# Patient Record
Sex: Female | Born: 1990 | Race: Black or African American | Hispanic: No | Marital: Single | Smoking: Never smoker
Health system: Southern US, Community
[De-identification: ages and names within clinical notes are randomized; demographics above are authoritative.]

## PROBLEM LIST (undated history)

## (undated) DIAGNOSIS — E0590 Thyrotoxicosis, unspecified without thyrotoxic crisis or storm: Secondary | ICD-10-CM

## (undated) DIAGNOSIS — F32A Depression, unspecified: Secondary | ICD-10-CM

## (undated) DIAGNOSIS — G4730 Sleep apnea, unspecified: Secondary | ICD-10-CM

## (undated) DIAGNOSIS — F4310 Post-traumatic stress disorder, unspecified: Secondary | ICD-10-CM

## (undated) HISTORY — DX: Depression, unspecified: F32.A

## (undated) HISTORY — DX: Post-traumatic stress disorder, unspecified: F43.10

---

## 2019-04-23 ENCOUNTER — Emergency Department (HOSPITAL_COMMUNITY): Admission: EM | Admit: 2019-04-23 | Discharge: 2019-04-23 | Discharge status: Absent without leave | Payer: Self-pay

## 2019-04-23 ENCOUNTER — Other Ambulatory Visit: Payer: Self-pay

## 2019-09-19 ENCOUNTER — Ambulatory Visit: Payer: Medicaid Other | Admitting: Family Medicine

## 2019-09-19 ENCOUNTER — Other Ambulatory Visit: Payer: Self-pay

## 2019-09-19 ENCOUNTER — Encounter: Payer: Self-pay | Admitting: Family Medicine

## 2019-09-19 VITALS — BP 118/62 | HR 105 | Ht 67.0 in | Wt 363.6 lb

## 2019-09-19 DIAGNOSIS — Z131 Encounter for screening for diabetes mellitus: Secondary | ICD-10-CM

## 2019-09-19 DIAGNOSIS — F339 Major depressive disorder, recurrent, unspecified: Secondary | ICD-10-CM

## 2019-09-19 DIAGNOSIS — N921 Excessive and frequent menstruation with irregular cycle: Secondary | ICD-10-CM

## 2019-09-19 DIAGNOSIS — E039 Hypothyroidism, unspecified: Secondary | ICD-10-CM

## 2019-09-19 DIAGNOSIS — N970 Female infertility associated with anovulation: Secondary | ICD-10-CM

## 2019-09-19 DIAGNOSIS — F332 Major depressive disorder, recurrent severe without psychotic features: Secondary | ICD-10-CM

## 2019-09-19 LAB — POCT GLYCOSYLATED HEMOGLOBIN (HGB A1C): Hemoglobin A1C: 6.1 % — AB (ref 4.0–5.6)

## 2019-09-19 LAB — POCT HEMOGLOBIN: Hemoglobin: 10.8 g/dL — AB (ref 11–14.6)

## 2019-09-19 MED ORDER — MEDROXYPROGESTERONE ACETATE 10 MG PO TABS: 10.0000 mg | ORAL_TABLET | Freq: Every day | ORAL | 0 refills | Status: DC

## 2019-09-19 NOTE — Progress Notes (Signed)
    SUBJECTIVE:   CHIEF COMPLAINT / HPI:    Peggy Patel is a 29 yr old female who presents today for a new PCP visit.   Menorrhagia  Patient has recently moved to [place] from [place]. She is currently menstruating and reports menorrhagia which has been intermittent for the last few years. Menstrual cycles are irregular, some years has had no menstrual cycles and sometimes is continuously menstruating throughout the month. Has seen a Theatre manager in [place] who recommended that her menstrual irregularities and menorrhagia were secondary to PCOS, obesity and hyperthyroidism. Tried birth control to help regulate periods but these were ineffective. Denies previous diagnosis of endometriosis or fibroids. Usually wears up to 4 pads at the same time. Passing clots frequently. Feels exhausted and constantly tired. Reports requiring admission to the ED for "iv fluids" but not blood transfusion.   PMH: Multiple suicide attempts, bipolar, anxiety, ptsd, major depression, hyperthyroidism, menorrhagia, prediabetes, PCOS, obesity, OSA. Last pap smear: 2017 which patient believes to be normal.   PSH:  None   FH:  Grandmother-T2DM, mom-HTN CVD and pancreatic cancer   DH:  None. Allergy: Pencillin: hives  SH: Currently unemployed. Studying to become beautician. Denies smoking, ETOH or illicit drugs. Lives with cousin and has a boyfriend.   PERTINENT  PMH / PSH: As above   OBJECTIVE:   BP 118/62   Pulse (!) 105   Ht 5\' 7"  (1.702 m)   Wt (!) 164.9 kg   SpO2 98%   BMI 56.95 kg/m   General: Well appearing, 29 yr old female, BMI>56, pleasant HEENT: Acanthosis nigrans noted over neck folds  Cardio: Normal S1 and S2, RRR  Pulm: Ctab, normal WOB  Abdomen: Bowel sounds normal. Abdomen soft and non-tender.  Extremities: No peripheral edema. Warm/ well perfused.  Strong radial pulse. Neuro: Cranial nerves grossly intact  ASSESSMENT/PLAN:   Anovulatory (dysfunctional uterine) bleeding Likely  dysfunctional uterine bleeding secondary to morbid obesity, PCOS and possible thyroid disorder. Pt reports having hyperthyroidism but hypothyroidism is more likely. POCT Hb 10 today (no baseline on system as patient is new to [facility]), pt is hemodynamically stable with no immediate need for admission to hospital for transfusion. -Prescribed 5 day course of provera to ease bleeding. Will follow up in the next 2 weeks. Will consider referral to GYN if continues to have menorrhagia -CBC to assess more accurate Hb, will consider iron studies and need for iron supplementation -TSH, A1c checked today to address above risk factors -Diet and exercise counseling provided, will reiterate on subsequent visits.   Depression [facility]. Denies suicidal ideation, thoughts of self harm or harm to others. Has been off psychiatric medication for many years due to personal preference. Will follow up in 2 weeks with myself where we can consider starting antidepressants but have referred to Psychiatry for further management of patient's complex psych conditions. Patient is happy with this plan.     [id number], MD [facility]

## 2019-09-19 NOTE — Patient Instructions (Addendum)
Hi Peggy Patel,  It was lovely to see you today! I am concerned about your heavy bleeding.  I have prescribed you a 5-day course of progesterone tablet which will help stop the bleeding.  I would like to see you back in clinic for a few weeks for follow-up.  I have also ordered lab tests for diabetes, thyroid disease and check your blood levels.  The blood level we did in clinic today showed that you are mildly anemic.  I have referred you to psychiatry for review of your psychiatric conditions.  I look forward to seeing you in a few weeks for a follow-up.  Best wishes and take care,  Dr. Allena Katz

## 2019-09-20 LAB — CBC WITH DIFFERENTIAL/PLATELET
Basophils Absolute: 0.1 10*3/uL (ref 0.0–0.2)
Basos: 1 %
EOS (ABSOLUTE): 0.2 10*3/uL (ref 0.0–0.4)
Eos: 2 %
Hematocrit: 33.3 % — ABNORMAL LOW (ref 34.0–46.6)
Hemoglobin: 10.7 g/dL — ABNORMAL LOW (ref 11.1–15.9)
Immature Grans (Abs): 0.1 10*3/uL (ref 0.0–0.1)
Immature Granulocytes: 1 %
Lymphocytes Absolute: 2.5 10*3/uL (ref 0.7–3.1)
Lymphs: 23 %
MCH: 26.2 pg — ABNORMAL LOW (ref 26.6–33.0)
MCHC: 32.1 g/dL (ref 31.5–35.7)
MCV: 82 fL (ref 79–97)
Monocytes Absolute: 0.7 10*3/uL (ref 0.1–0.9)
Monocytes: 7 %
Neutrophils Absolute: 7.3 10*3/uL — ABNORMAL HIGH (ref 1.4–7.0)
Neutrophils: 66 %
Platelets: 335 10*3/uL (ref 150–450)
RBC: 4.08 x10E6/uL (ref 3.77–5.28)
RDW: 14.4 % (ref 11.7–15.4)
WBC: 10.8 10*3/uL (ref 3.4–10.8)

## 2019-09-20 LAB — TSH: TSH: 8.99 u[IU]/mL — ABNORMAL HIGH (ref 0.450–4.500)

## 2019-09-24 DIAGNOSIS — F329 Major depressive disorder, single episode, unspecified: Secondary | ICD-10-CM | POA: Insufficient documentation

## 2019-09-24 DIAGNOSIS — N970 Female infertility associated with anovulation: Secondary | ICD-10-CM | POA: Insufficient documentation

## 2019-09-24 DIAGNOSIS — N920 Excessive and frequent menstruation with regular cycle: Secondary | ICD-10-CM | POA: Insufficient documentation

## 2019-09-24 DIAGNOSIS — F332 Major depressive disorder, recurrent severe without psychotic features: Secondary | ICD-10-CM | POA: Insufficient documentation

## 2019-09-24 DIAGNOSIS — F32A Depression, unspecified: Secondary | ICD-10-CM | POA: Insufficient documentation

## 2019-09-24 NOTE — Assessment & Plan Note (Signed)
Likely dysfunctional uterine bleeding secondary to morbid obesity, PCOS and possible thyroid disorder. Pt reports having hyperthyroidism but hypothyroidism is more likely. POCT Hb 10 today (no baseline on system as patient is [facility]), pt is hemodynamically stable with no immediate need for admission to hospital for transfusion. -Prescribed 5 day course of provera to ease bleeding. Will follow up in the next 2 weeks. Will consider referral to GYN if continues to have menorrhagia -CBC to assess more accurate Hb, will consider iron studies and need for iron supplementation -TSH, A1c checked today to address above risk factors -Diet and exercise counseling provided, will reiterate on subsequent visits.

## 2019-09-24 NOTE — Assessment & Plan Note (Addendum)
[id number]. Denies suicidal ideation, thoughts of self harm or harm to others. Has been off psychiatric medication for many years due to personal preference. Will follow up in 2 weeks with myself where we can consider starting antidepressants but have referred to Psychiatry for further management of patient's complex psych conditions. Patient is happy with this plan.

## 2019-09-25 ENCOUNTER — Telehealth: Payer: Self-pay | Admitting: Family Medicine

## 2019-09-25 NOTE — Telephone Encounter (Signed)
Called patient and left VM of her recent lab results. Recommended that she needs further labs for her thyroid and investigation of her anemia.

## 2019-10-02 ENCOUNTER — Telehealth (INDEPENDENT_AMBULATORY_CARE_PROVIDER_SITE_OTHER): Payer: Self-pay | Admitting: Family Medicine

## 2019-10-02 ENCOUNTER — Other Ambulatory Visit: Payer: Self-pay

## 2019-10-02 DIAGNOSIS — N970 Female infertility associated with anovulation: Secondary | ICD-10-CM

## 2019-10-02 DIAGNOSIS — N921 Excessive and frequent menstruation with irregular cycle: Secondary | ICD-10-CM

## 2019-10-02 MED ORDER — NORETHIN ACE-ETH ESTRAD-FE 1-20 MG-MCG(24) PO TABS: 1.0000 | ORAL_TABLET | Freq: Every day | ORAL | 11 refills | Status: DC

## 2019-10-05 NOTE — Assessment & Plan Note (Signed)
Poor response to 5 day course of Provera. Differential for her dysfunctional uterine bleeding/menorrhagia is broad. Most likely cause is a combination of PCOS, obesity and hypothyroidism (unmedicated). Considering structural abnormalities such as fibroids, adenomyosis and polyps. These would be visualized on [place]. Malignancy/hyperplasia less likely due to age however [place] will help exclude this. Also considered coagulopathy however no previous of history of this or family history of bleeding disorders. Iatrogenic cause less likely as patient does not take any anticoagulants/antiplatelet medications.  Ultimately Peggy Patel would be a good candidate for IUD but will opt for OCP now to help control the bleeding.  -Transvaginal ultrasound to look for structural abnormalities of menorrhagia -Starting Loestrin-Fe today, prescribed 3 month supply -Follow up with me in May. Will do anemia panel then.

## 2019-10-05 NOTE — Progress Notes (Signed)
.   [facility] Telemedicine Visit  Patient consented to have virtual visit and was identified by name and date of birth. Method of visit: Telephone  Encounter participants: Patient: Estate manager/land agent - located at home  Provider: Towanda Octave - located at [facility]    Chief Complaint: Menorrhagia  HPI:  Menorrhagia/Dysfunctional uterine bleeding Has been experiencing menorrhagia in the recent days again. Completed 5 day course of Provera prescribed by myself a few weeks. The bleeding slowed down however a few days later started to bleed more requiring 5-6 sanitary pads. She is now passing clots and she endorses severe abdominal cramps "like someone is trying to pull my stomach out". She feels extremely tired and "I just want to sleep all day". Would like another medication to help stop the bleeding  ROS: per HPI  Pertinent PMHx: PCOS, Obesity, Bipolar disorder, Hypothyrodism  Exam:  There were no vitals taken for this visit.  Respiratory: Able to speak in full sentences   Assessment/Plan:  Anovulatory (dysfunctional uterine) bleeding Poor response to 5 day course of Provera. Differential for her dysfunctional uterine bleeding/menorrhagia is broad. Most likely cause is a combination of PCOS, obesity and hypothyroidism (unmedicated). Considering structural abnormalities such as fibroids, adenomyosis and polyps. These would be visualized on [place]. Malignancy/hyperplasia less likely due to age however [place] will help exclude this. Also considered coagulopathy however no previous of history of this or family history of bleeding disorders. Iatrogenic cause less likely as patient does not take any anticoagulants/antiplatelet medications.  Ultimately Pearline would be a good candidate for IUD but will opt for OCP now to help control the bleeding.  -Transvaginal ultrasound to look for structural abnormalities of menorrhagia -Starting Loestrin-Fe today, prescribed 3 month supply -Follow  up with me in May. Will do anemia panel then.     Time spent during visit with patient: 15 minutes

## 2019-11-10 ENCOUNTER — Ambulatory Visit: Payer: Self-pay | Admitting: Licensed Clinical Social Worker

## 2019-11-10 ENCOUNTER — Ambulatory Visit (INDEPENDENT_AMBULATORY_CARE_PROVIDER_SITE_OTHER): Payer: Self-pay | Admitting: Family Medicine

## 2019-11-10 ENCOUNTER — Other Ambulatory Visit: Payer: Self-pay

## 2019-11-10 VITALS — BP 118/70 | HR 130 | Ht 67.0 in | Wt 353.0 lb

## 2019-11-10 DIAGNOSIS — Z741 Need for assistance with personal care: Secondary | ICD-10-CM

## 2019-11-10 DIAGNOSIS — M549 Dorsalgia, unspecified: Secondary | ICD-10-CM

## 2019-11-10 DIAGNOSIS — N970 Female infertility associated with anovulation: Secondary | ICD-10-CM

## 2019-11-10 DIAGNOSIS — L740 Miliaria rubra: Secondary | ICD-10-CM

## 2019-11-10 DIAGNOSIS — M545 Low back pain, unspecified: Secondary | ICD-10-CM

## 2019-11-10 NOTE — Chronic Care Management (AMB) (Signed)
   Social Work  Care Management Consultation  11/10/2019 Name: Peggy Patel MRN: [id number] DOB: [birth year] Peggy Patel is a 29 y.o. year old female who sees Towanda Octave, MD for primary care. LCSW was consulted by PCP for information /resources to assistance patient with  Psychosocial Support, resources.      Assessment: Patient has no insurance and income.  Has not been able to meet all medical needs. Recommendation: After consultation with provider it is determined that patient may benefit from applying for affordable care act, completing Orange card application and gift card from [facility] indigent fund to purchase OTC items.  Intervention: Patient briefly interviewed during this encounter to provide 2 [facility] gift cards and review process for affordable care act.  LCSW collaborated with PCP, conducted brief assessment and provided recommendations. Relevant information resources discussed with provider. Provider gave information to patient.   Plan:  1. The care management team is available to follow up with the patient after formal CCM referral is placed  2. Please consult with patient prior to making referral  3. No further follow up required by LCSW at this time  Review of patient status, including review of consultants reports, relevant laboratory and other test results, and collaboration with appropriate care team members and the patient's provider was performed as part of comprehensive patient evaluation and provision of chronic care management services.      Sammuel Hines, LCSW Chronic Care Coordination  [facility] / [facility]   [phone] 4:05 PM

## 2019-11-10 NOTE — Patient Instructions (Signed)
Lovely to see you again today. Please call the number provided to apply for insurance.  Please fill out the orange card application which will help [place] to find you insurance.  For your itchy skin, I would recommend an over-the-counter antihistamine such as loratadine or cetirizine.  We are giving you purchase this.  Low back pain sounds musculoskeletal, please try taking Tylenol and 1 type of anti-inflammatory.  Mixed anti-inflammatory medications.  You can also try heat and cold packs.  Best wishes, Dr. Allena Katz

## 2019-11-10 NOTE — Progress Notes (Signed)
SUBJECTIVE:   CHIEF COMPLAINT / HPI:  Peggy Patel is a 29 yr old female who presents today for eczema and lower back pain  Eczema Patient endorses 3 to 4-day history of itching over her hands, of her neck and generally all over her body.  She first noticed this after she was showering.  She is also noticed some " blister bumps" and thinks this is due to eczema.  She was seen by previous PCP for eczema and was using hydrocortisone cream.  She has noticed an obvious rash except for the blister bumps.  She has been told by the doctors that rashes and bruises do not appear while on her skin and she feels them from " inside".  Denies use of any new detergents, perfumes etc.  Lower back pain Endorses chronic lower back pain.  She initially injured her back a few years ago while lifting something heavy and felt a " popping sensation" in her back.  She then fell again twice over the last year and her back pain is worsened.  Located in the middle to left lumbar region and described as a stabbing pain and denies radiation.  Pain is described as " of the scale".  She is unable to lay down or walk without pain.  She usually lies on her right side.  She has been to urgent care several times and has tried naproxen, Advil and Tylenol without much relief.  Denies fevers, weight loss, saddle anesthesia, bowel or urinary incontinence or paresthesia.  Poor finances Patient is currently unemployed.  She fell last year injuring her back at that time she was working at a [employer].  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  Since then she has been struggling with the back pain and is unable to find a job.  She has no income and is currently staying with her cousin.  She is unable to afford food and buys food via food stamps.  PERTINENT  PMH / PSH: Menorrhagia, morbid obesity, hypothyroidism  OBJECTIVE:   BP 118/70    Pulse (!) 130   Ht 5\' 7"  (1.702 m)   Wt (!) 353 lb (160.1 kg)   SpO2 96%   BMI 55.29 kg/m   General: Alert, 29 year old [place] American female, no acute distress,  HEENT: Hyperpigmentation noted over neck, no obvious rash Cardio: Normal S1 and S2, RRR Pulm: CTAB, normal work of breathing Abdomen: Bowel sounds normal. Abdomen soft and non-tender.  Extremities: No obvious rash noted on examination of hands however small vesicles noted. No peripheral edema. Warm/ well perfused. Strong radial pulse  MSK: Antalgic gait, difficulty ambulating from chair to bed, 5/5 strength in upper and lower extremities, normal sensation throughout, no spinal tenderness, tenderness of left lumbar, paraspinal muscles Neuro: Cranial nerves grossly intact  ASSESSMENT/PLAN:   Dependent for money management Unfortunately patient is unable to afford healthcare due to lack of employment.  Her physical conditions (obesity, back pain, heavy menstrual bleeding etc) with her in a poor state of health and also limit her be able to find a job.  She is currently living with her cousin and living of food stamps. Provided patient with coupon for [facility] where she can buy analgesia for back pain.  Also provided patient with orange card form to help her afford her medications and clinic visits.  Patient will follow up with me once she has  a orange card approved.  Heat rash Considered eczema however no rash evident on examination.  Would expect classic scaly erythematous patches/plaques if it was eczema. Her symptoms are more pruritic with some evidence of heat rash vesicles over her hands which is likely secondary to heat. Recommended that patient initially tries antihistamine for symptomatic relief however she reports being unable to afford this.  Musculoskeletal back pain Back pain is likely musculoskeletal in origin. It is likely that her morbid obesity is also contributing to the back pain. No red flag symptoms so can most  likely exclude fracture, disc herniation, tumor, osteomyelitis as cause of her symptoms. Physical exam is reassuring with moderate pain on palpation of left lumbar paraspinal muscles. I do not feel that imaging such as MRI/xray would be indicated based on history and physical findings today. -Tylenol Q6H and Ibuprofen 400mg  TID (pt has been provided with coupon for [facility]) -Heat and ice therapy -Encouraged patient to stay as active as possible -Would recommended referral to PT after pt has orange card approval.      , MD [facility]

## 2019-11-14 DIAGNOSIS — M549 Dorsalgia, unspecified: Secondary | ICD-10-CM | POA: Insufficient documentation

## 2019-11-14 DIAGNOSIS — L740 Miliaria rubra: Secondary | ICD-10-CM | POA: Insufficient documentation

## 2019-11-14 DIAGNOSIS — G8929 Other chronic pain: Secondary | ICD-10-CM | POA: Insufficient documentation

## 2019-11-14 DIAGNOSIS — Z741 Need for assistance with personal care: Secondary | ICD-10-CM | POA: Insufficient documentation

## 2019-11-14 NOTE — Assessment & Plan Note (Signed)
Considered eczema however no rash evident on examination.  Would expect classic scaly erythematous patches/plaques if it was eczema. Her symptoms are more pruritic with some evidence of heat rash vesicles over her hands which is likely secondary to heat. Recommended that patient initially tries antihistamine for symptomatic relief however she reports being unable to afford this.

## 2019-11-14 NOTE — Assessment & Plan Note (Addendum)
Back pain is likely musculoskeletal in origin. It is likely that her morbid obesity is also contributing to the back pain. No red flag symptoms so can most likely exclude fracture, disc herniation, tumor, osteomyelitis as cause of her symptoms. Physical exam is reassuring with moderate pain on palpation of left lumbar paraspinal muscles. I do not feel that imaging such as MRI/xray would be indicated based on history and physical findings today. -Tylenol Q6H and Ibuprofen 400mg  TID (pt has been provided with coupon for [facility]) -Heat and ice therapy -Encouraged patient to stay as active as possible -Would recommended referral to PT after pt has orange card approval.

## 2019-11-14 NOTE — Assessment & Plan Note (Deleted)
Back pain is likely musculoskeletal in origin. It is likely that her morbid obesity is also contributing to the back pain. No red flag symptoms so can most likely exclude fracture, disc herniation, tumor, osteomyelitis as cause of her symptoms. Physical exam is reassuring with moderate pain on palpation of left lumbar paraspinal muscles. -Tylenol Q6H and Ibuprofen 400mg  TID (pt has been provided with coupon for [facility]) -Heat and ice therapy -Encouraged patient to stay as active as possible -Would recommended referral to PT after pt has orange card approval.

## 2019-11-14 NOTE — Assessment & Plan Note (Signed)
Unfortunately patient is unable to afford healthcare due to lack of employment.  Her physical conditions (obesity, back pain, heavy menstrual bleeding etc) with her in a poor state of health and also limit her be able to find a job.  She is currently living with her cousin and living of food stamps. Provided patient with coupon for [facility] where she can buy analgesia for back pain.  Also provided patient with orange card form to help her afford her medications and clinic visits.  Patient will follow up with me once she has a orange card approved.

## 2019-11-29 ENCOUNTER — Telehealth (HOSPITAL_COMMUNITY): Payer: Self-pay | Admitting: Psychiatry

## 2019-11-29 ENCOUNTER — Other Ambulatory Visit: Payer: Self-pay

## 2020-02-23 ENCOUNTER — Emergency Department (HOSPITAL_COMMUNITY): Payer: 59

## 2020-02-23 ENCOUNTER — Other Ambulatory Visit: Payer: Self-pay

## 2020-02-23 ENCOUNTER — Encounter (HOSPITAL_COMMUNITY): Payer: Self-pay | Admitting: Emergency Medicine

## 2020-02-23 ENCOUNTER — Emergency Department (HOSPITAL_COMMUNITY)
Admission: EM | Admit: 2020-02-23 | Discharge: 2020-02-23 | Disposition: A | Discharge status: Home / Self Care | Payer: 59 | Attending: Emergency Medicine | Admitting: Emergency Medicine

## 2020-02-23 DIAGNOSIS — W1842XA Slipping, tripping and stumbling without falling due to stepping into hole or opening, initial encounter: Secondary | ICD-10-CM | POA: Insufficient documentation

## 2020-02-23 DIAGNOSIS — S93492A Sprain of other ligament of left ankle, initial encounter: Secondary | ICD-10-CM | POA: Diagnosis not present

## 2020-02-23 DIAGNOSIS — M25572 Pain in left ankle and joints of left foot: Secondary | ICD-10-CM | POA: Diagnosis present

## 2020-02-23 DIAGNOSIS — S93402A Sprain of unspecified ligament of left ankle, initial encounter: Secondary | ICD-10-CM

## 2020-02-23 MED ORDER — NAPROXEN 375 MG PO TABS: 375.0000 mg | ORAL_TABLET | Freq: Two times a day (BID) | ORAL | 0 refills | Status: DC

## 2020-02-23 NOTE — ED Provider Notes (Signed)
[facility] EMERGENCY DEPARTMENT Provider Note   CSN: [id number] Arrival date & time: 02/23/20  1349     History Chief Complaint  Patient presents with  . Ankle Pain    Peggy Patel is a 29 y.o. female.  HPI   Patient presents to the ED for evaluation of ankle pain.  Patient states she was walking when she slipped and twisted her left ankle.  Patient has been having persistent pain on both sides her ankle since that time.  The pain increases with walking.  She denies any numbness or weakness.  No other injuries  History reviewed. No pertinent past medical history.  Patient Active Problem List   Diagnosis Date Noted  . Dependent for money management 11/14/2019  . Heat rash 11/14/2019  . Musculoskeletal back pain 11/14/2019  . Anovulatory (dysfunctional uterine) bleeding 09/24/2019  . Depression 09/24/2019    History reviewed. No pertinent surgical history.   OB History   No obstetric history on file.     History reviewed. No pertinent family history.  Social History   Tobacco Use  . Smoking status: Never Smoker  . Smokeless tobacco: Never Used  Substance Use Topics  . Alcohol use: Not on file  . Drug use: Not on file    Home Medications Prior to Admission medications   Medication Sig Start Date End Date Taking? Authorizing Provider  medroxyPROGESTERone (PROVERA) 10 MG tablet Take 1 tablet (10 mg total) by mouth daily for 5 days. 09/19/19 09/24/19  Towanda Octave, MD  naproxen (NAPROSYN) 375 MG tablet Take 1 tablet (375 mg total) by mouth 2 (two) times daily. 02/23/20   Linwood Dibbles, MD  Norethindrone Acetate-Ethinyl Estrad-FE (LOESTRIN 24 FE) 1-20 MG-MCG(24) tablet Take 1 tablet by mouth daily. 10/02/19   Towanda Octave, MD    Allergies    Penicillins  Review of Systems   Review of Systems  All other systems reviewed and are negative.   Physical Exam Updated Vital Signs BP 130/66   Pulse 78   Temp 98.5 F (36.9 C) (Oral)   Resp 15    SpO2 98%   Physical Exam Vitals and nursing note reviewed.  Constitutional:      General: She is not in acute distress.    Appearance: She is well-developed.  HENT:     Head: Normocephalic and atraumatic.     Right Ear: External ear normal.     Left Ear: External ear normal.  Eyes:     General: No scleral icterus.       Right eye: No discharge.        Left eye: No discharge.     Conjunctiva/sclera: Conjunctivae normal.  Neck:     Trachea: No tracheal deviation.  Cardiovascular:     Rate and Rhythm: Normal rate.  Pulmonary:     Effort: Pulmonary effort is normal. No respiratory distress.     Breath sounds: No stridor.  Abdominal:     General: There is no distension.  Musculoskeletal:        General: No swelling or deformity.     Cervical back: Neck supple.     Left ankle: No swelling or deformity. Tenderness present over the lateral malleolus and medial malleolus.     Left Achilles Tendon: Normal.     Left foot: Normal.  Skin:    General: Skin is warm and dry.     Findings: No rash.  Neurological:     Mental Status: She is alert.  Cranial Nerves: Cranial nerve deficit: no gross deficits.     ED Results / Procedures / Treatments   Labs (all labs ordered are listed, but only abnormal results are displayed) Labs Reviewed - No data to display  EKG None  Radiology DG Ankle Complete Left  Result Date: 02/23/2020 CLINICAL DATA:  Twisted ankle.  Pain in the medial malleolus EXAM: LEFT ANKLE COMPLETE - 3+ VIEW COMPARISON:  None. FINDINGS: No acute fracture or dislocation. Joint spaces and alignment are maintained. No area of erosion or osseous destruction. No unexpected radiopaque foreign body. Mild soft tissue edema. IMPRESSION: No acute fracture or dislocation. Electronically Signed   By: Meda Klinefelter MD   On: 02/23/2020 14:58    Procedures Procedures (including critical care time)  Medications Ordered in ED Medications - No data to display  ED Course    I have reviewed the triage vital signs and the nursing notes.  Pertinent labs & imaging results that were available during my care of the patient were reviewed by me and considered in my medical decision making (see chart for details).    MDM Rules/Calculators/A&P                         X-rays without signs of fracture or dislocation.  Exam consistent with an ankle sprain.  Will provide crutches and a splint.  NSAIDs.  Follow-up with Ortho if symptoms are not improving Final Clinical Impression(s) / ED Diagnoses Final diagnoses:  Sprain of left ankle, unspecified ligament, initial encounter    Rx / DC Orders ED Discharge Orders         Ordered    naproxen (NAPROSYN) 375 MG tablet  2 times daily        02/23/20 2224           Linwood Dibbles, MD 02/23/20 2229

## 2020-02-23 NOTE — ED Notes (Signed)
Patient verbalizes understanding of discharge instructions. Opportunity for questioning and answers were provided. Armband removed by staff, pt discharged from ED ambulatory with crutches. ° °

## 2020-02-23 NOTE — ED Triage Notes (Signed)
C/o ankle pain since yesterday; unrelieved by OTC motrin.

## 2020-02-23 NOTE — Discharge Instructions (Addendum)
Take medication as needed for pain.  Wear the splint to help stabilize your ankle.  Use the crutches as needed.

## 2020-04-24 ENCOUNTER — Ambulatory Visit (INDEPENDENT_AMBULATORY_CARE_PROVIDER_SITE_OTHER): Payer: 59 | Admitting: Family Medicine

## 2020-04-24 ENCOUNTER — Other Ambulatory Visit: Payer: Self-pay

## 2020-04-24 ENCOUNTER — Encounter: Payer: Self-pay | Admitting: Family Medicine

## 2020-04-24 VITALS — BP 118/64 | HR 90 | Ht 67.0 in | Wt 346.0 lb

## 2020-04-24 DIAGNOSIS — M545 Low back pain, unspecified: Secondary | ICD-10-CM

## 2020-04-24 DIAGNOSIS — G8929 Other chronic pain: Secondary | ICD-10-CM | POA: Diagnosis not present

## 2020-04-24 MED ORDER — CYCLOBENZAPRINE HCL 10 MG PO TABS: 10.0000 mg | ORAL_TABLET | Freq: Every day | ORAL | 0 refills | Status: DC

## 2020-04-24 NOTE — Progress Notes (Signed)
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 Pain is in the lower left part of back and buttocks No radiation into the legs Will feel pain in her pelvis at times if she stands for too long She had previously taken naproxen but states that it doesn't help, tylenol also hasn't helped She has used an arthritis heat cream with no improvement No changes in bowel or bladder habits, no N/T in groin, no N/T in legs No fevers or night sweats Did physical therapy years ago when she first injured it, went to the hospital for that She was told at that time it was probably a slipped disc and to go home and take tylenol Had x-rays at the time she thinks, but was in [place] Pain is the same as it was, comes and goes Overall has been worsening since she injured it originally Sometimes does heavy lifting at work which also bothers it When she fell on it last year in December she states that she went to Urgent Care and had imaging, told that there was no fracture Has pain with sitting and standing, laying down makes it feel better    Office Visit from 04/24/2020 in [facility]  PHQ-9 Total Score 21    Denies SI   PERTINENT  PMH / PSH: History of depression, chronic back pain  OBJECTIVE:   BP 118/64    Pulse 90    Ht 5\' 7"  (1.702 m)    Wt (!) 346 lb (156.9 kg)    LMP 08/23/2019    SpO2 98%    BMI 54.19 kg/m    Physical Exam:  General: 29 y.o. female in NAD  Lumbar spine: - Inspection: Increased Lumbar lordosis.  No gross asymmetry, swelling or ecchymosis - Palpation: No TTP over the spinous processes.  She does have some TTP along left paraspinal musculature, left piriformis, left SI - ROM:  Flexion to 90 degrees and pain and tightness noted, full range of motion in extension, pain in all fields - Strength: 5/5 strength of lower extremity in L4-S1 nerve root distributions b/l; normal gait - Neuro: sensation intact in the L4-S1 nerve root distribution b/l, 2+ L4 and S1 reflexes - Special testing: Negative straight leg raise, negative slump, negative Stork test, pain in back with FABER, FADIR, stretching of hamstrings with tightness    ASSESSMENT/PLAN:   Chronic left-sided low back pain without sciatica He has no red flag symptoms indicating cauda equina or impingement of nerve.  She does not have radiation of her back pain.  It seems very muscular in origin.  She also has very tight hamstrings which are likely contributing to her pain.  Discussed with her that her weight is likely also contributing.  Given chronicity, will go ahead and obtain plain films.  No concern for fracture at this time.  We will start with physical therapy, Flexeril at night, Tylenol and ibuprofen throughout the day, can also use topical including Salonpas patches, heat, Voltaren gel if she desires.  We will have her follow-up in 4 weeks for this.   She has numerous other chronic medical problems and complaints.  Advised her to follow-up with her PCP prior to her follow-up in 4 weeks for her back pain.  She denies SI, she also follow-up for depression.  Unknown Jim, DO [facility]

## 2020-04-24 NOTE — Assessment & Plan Note (Signed)
He has no red flag symptoms indicating cauda equina or impingement of nerve.  She does not have radiation of her back pain.  It seems very muscular in origin.  She also has very tight hamstrings which are likely contributing to her pain.  Discussed with her that her weight is likely also contributing.  Given chronicity, will go ahead and obtain plain films.  No concern for fracture at this time.  We will start with physical therapy, Flexeril at night, Tylenol and ibuprofen throughout the day, can also use topical including Salonpas patches, heat, Voltaren gel if she desires.  We will have her follow-up in 4 weeks for this.

## 2020-04-24 NOTE — Patient Instructions (Signed)
Thank you for coming to see me today. It was a pleasure. Today we talked about:   I have placed an order for x-rays of your back.  Please go to [facility] to have this completed.  You do not need an appointment. We will contact you with your results afterwards.  Take Flexeril at night for your back pain, do not take this when you are going to drive as it can make you drowsy.  You can take Tylenol or ibuprofen throughout the day.  You can also alternate these.  You can use heat on your back, lidocaine patches, Salonpas patches, or Voltaren gel if this helps your back pain.  We have also referred you to physical therapy, please give them a call in the next few days at [phone] to schedule.   Please follow-up with me or PCP in 1 month.  If you have any questions or concerns, please do not hesitate to call the office at [phone].  Best,   Luis Abed, DO

## 2020-05-16 ENCOUNTER — Ambulatory Visit: Payer: 59 | Attending: Family Medicine | Admitting: Physical Therapy

## 2021-03-23 ENCOUNTER — Emergency Department (HOSPITAL_COMMUNITY)
Admission: EM | Admit: 2021-03-23 | Discharge: 2021-03-24 | Disposition: A | Discharge status: Home / Self Care | Payer: No Typology Code available for payment source | Attending: Emergency Medicine | Admitting: Emergency Medicine

## 2021-03-23 ENCOUNTER — Emergency Department (HOSPITAL_COMMUNITY): Payer: No Typology Code available for payment source

## 2021-03-23 DIAGNOSIS — D72829 Elevated white blood cell count, unspecified: Secondary | ICD-10-CM | POA: Insufficient documentation

## 2021-03-23 DIAGNOSIS — D649 Anemia, unspecified: Secondary | ICD-10-CM | POA: Insufficient documentation

## 2021-03-23 DIAGNOSIS — R109 Unspecified abdominal pain: Secondary | ICD-10-CM | POA: Diagnosis present

## 2021-03-23 DIAGNOSIS — K8020 Calculus of gallbladder without cholecystitis without obstruction: Secondary | ICD-10-CM | POA: Diagnosis not present

## 2021-03-23 LAB — CBC WITH DIFFERENTIAL/PLATELET
Abs Immature Granulocytes: 0.11 10*3/uL — ABNORMAL HIGH (ref 0.00–0.07)
Basophils Absolute: 0.1 10*3/uL (ref 0.0–0.1)
Basophils Relative: 0 %
Eosinophils Absolute: 0.2 10*3/uL (ref 0.0–0.5)
Eosinophils Relative: 2 %
HCT: 36.3 % (ref 36.0–46.0)
Hemoglobin: 10.5 g/dL — ABNORMAL LOW (ref 12.0–15.0)
Immature Granulocytes: 1 %
Lymphocytes Relative: 14 %
Lymphs Abs: 2 10*3/uL (ref 0.7–4.0)
MCH: 23.3 pg — ABNORMAL LOW (ref 26.0–34.0)
MCHC: 28.9 g/dL — ABNORMAL LOW (ref 30.0–36.0)
MCV: 80.7 fL (ref 80.0–100.0)
Monocytes Absolute: 0.7 10*3/uL (ref 0.1–1.0)
Monocytes Relative: 5 %
Neutro Abs: 10.9 10*3/uL — ABNORMAL HIGH (ref 1.7–7.7)
Neutrophils Relative %: 78 %
Platelets: 331 10*3/uL (ref 150–400)
RBC: 4.5 MIL/uL (ref 3.87–5.11)
RDW: 17.5 % — ABNORMAL HIGH (ref 11.5–15.5)
WBC: 14 10*3/uL — ABNORMAL HIGH (ref 4.0–10.5)
nRBC: 0 % (ref 0.0–0.2)

## 2021-03-23 LAB — COMPREHENSIVE METABOLIC PANEL
ALT: 16 U/L (ref 0–44)
AST: 16 U/L (ref 15–41)
Albumin: 3.4 g/dL — ABNORMAL LOW (ref 3.5–5.0)
Alkaline Phosphatase: 54 U/L (ref 38–126)
Anion gap: 9 (ref 5–15)
BUN: 13 mg/dL (ref 6–20)
CO2: 28 mmol/L (ref 22–32)
Calcium: 9 mg/dL (ref 8.9–10.3)
Chloride: 101 mmol/L (ref 98–111)
Creatinine, Ser: 0.92 mg/dL (ref 0.44–1.00)
GFR, Estimated: >60 mL/min (ref >60)
Glucose, Bld: 110 mg/dL — ABNORMAL HIGH (ref 70–99)
Potassium: 4.1 mmol/L (ref 3.5–5.1)
Sodium: 138 mmol/L (ref 135–145)
Total Bilirubin: 0.2 mg/dL — ABNORMAL LOW (ref 0.3–1.2)
Total Protein: 8.2 g/dL — ABNORMAL HIGH (ref 6.5–8.1)

## 2021-03-23 LAB — URINALYSIS, ROUTINE W REFLEX MICROSCOPIC
Bacteria, UA: NONE SEEN
Bilirubin Urine: NEGATIVE
Glucose, UA: NEGATIVE mg/dL
Hgb urine dipstick: NEGATIVE
Ketones, ur: NEGATIVE mg/dL
Nitrite: NEGATIVE
Protein, ur: 30 mg/dL — AB
Specific Gravity, Urine: 1.026 (ref 1.005–1.030)
pH: 5 (ref 5.0–8.0)

## 2021-03-23 LAB — LIPASE, BLOOD: Lipase: 25 U/L (ref 11–51)

## 2021-03-23 LAB — I-STAT BETA HCG BLOOD, ED (MC, WL, AP ONLY): I-stat hCG, quantitative: <5 m[IU]/mL (ref <5)

## 2021-03-23 NOTE — ED Triage Notes (Signed)
Pt. Stated, Peggy Patel been having stomach pain since I was 14 off and on. I have problem using the bathroom or Im nauseated.

## 2021-03-23 NOTE — ED Provider Notes (Signed)
Emergency Medicine Provider Triage Evaluation Note  Peggy Patel , a 30 y.o. female  was evaluated in triage.  Pt complains of right upper quadrant tenderness.  States has had pain like this since she was 30 years old, worsened today.  The pain is worse after she eats, sometimes worse when she lays down flat.  Primarily in the right upper quadrant, but it can be diffuse throughout her abdomen.  She is nauseated, has not thrown up.  No prior abdominal surgeries..  Does not drink alcohol, denies significant amount of NSAID use.  Review of Systems  Positive: Abdominal pain, nausea Negative: Vomiting  Physical Exam  BP (!) 166/115 (BP Location: Right Wrist)   Pulse (!) 102   Temp 99.3 F (37.4 C) (Oral)   Resp 18   SpO2 99%  Gen:   Awake, no distress   Resp:  Normal effort  MSK:   Moves extremities without difficulty  Other:  Right upper quadrant tenderness, positive Murphy sign.  Medical Decision Making  Medically screening exam initiated at 3:55 PM.  Appropriate orders placed.  Peggy Patel was informed that the remainder of the evaluation will be completed by another provider, this initial triage assessment does not replace that evaluation, and the importance of remaining in the ED until their evaluation is complete.  Labs, ultrasound.  Suspect cholelithiasis, patient has high temperature but is not febrile.  Mild tachycardia.   Theron Arista, PA-C 03/23/21 1556    Benjiman Core, MD 03/23/21 2115

## 2021-03-24 MED ORDER — HYDROMORPHONE HCL 1 MG/ML IJ SOLN
1.0000 mg | Freq: Once | INTRAMUSCULAR | Status: AC
  Administered 2021-03-24: 1 mg via INTRAVENOUS
  Filled 2021-03-24: qty 1

## 2021-03-24 NOTE — Discharge Instructions (Signed)
Please call general surgery today. The phone number is included in your paperwork.  Suggestions for gallbladder eating plan is also included in your paperwork.

## 2021-03-24 NOTE — ED Provider Notes (Signed)
[facility] EMERGENCY DEPARTMENT Provider Note   CSN: [id number] Arrival date & time: 03/23/21  1443     History Chief Complaint  Patient presents with   Abdominal Pain    Peggy Patel is a 30 y.o. female. No notable PMH. Patient presents with complaints of RUQ abdominal pain. She states that she has been having these pains since she was 30-10 years old. They are usually in the RUQ, but she also has generalized abdominal pain. Pain is worse after eating. Nothing has made it better. She states that the pain was more intense than usual last night, which prompted her to come to the ED. She has some associated nausea when the episodes occur. Patient states that her diet is high in fat and fried foods. Patient denies any fever or chills. Denies shortness of breath, vomiting, constipation, diarrhea, chest pain.   Abdominal Pain Associated symptoms: nausea   Associated symptoms: no chest pain, no chills, no constipation, no cough, no diarrhea, no dysuria, no fever, no hematuria, no shortness of breath, no sore throat and no vomiting       No past medical history on file.  Patient Active Problem List   Diagnosis Date Noted   Chronic left-sided low back pain without sciatica 11/14/2019   Dependent for money management 11/14/2019   Heat rash 11/14/2019   Musculoskeletal back pain 11/14/2019   Anovulatory (dysfunctional uterine) bleeding 09/24/2019   Depression 09/24/2019    No past surgical history on file.   OB History   No obstetric history on file.     No family history on file.  Social History   Tobacco Use   Smoking status: Never   Smokeless tobacco: Never    Home Medications Prior to Admission medications   Medication Sig Start Date End Date Taking? Authorizing Provider  cyclobenzaprine (FLEXERIL) 10 MG tablet Take 1 tablet (10 mg total) by mouth at bedtime. Patient not taking: Reported on 03/24/2021 04/24/20   Meccariello, Solmon Ice, DO   naproxen (NAPROSYN) 375 MG tablet Take 1 tablet (375 mg total) by mouth 2 (two) times daily. Patient not taking: Reported on 03/24/2021 02/23/20   Linwood Dibbles, MD  Norethindrone Acetate-Ethinyl Estrad-FE (LOESTRIN 24 FE) 1-20 MG-MCG(24) tablet Take 1 tablet by mouth daily. Patient not taking: Reported on 03/24/2021 10/02/19   Towanda Octave, MD    Allergies    Penicillins and Tomato  Review of Systems   Review of Systems  Constitutional:  Negative for chills and fever.  HENT:  Negative for congestion, rhinorrhea and sore throat.   Eyes:  Negative for visual disturbance.  Respiratory:  Negative for cough, chest tightness and shortness of breath.   Cardiovascular:  Negative for chest pain, palpitations and leg swelling.  Gastrointestinal:  Positive for abdominal pain and nausea. Negative for blood in stool, constipation, diarrhea and vomiting.  Genitourinary:  Negative for dysuria, flank pain and hematuria.  Musculoskeletal:  Negative for back pain.  Skin:  Negative for rash and wound.  Neurological:  Negative for dizziness, syncope, weakness, light-headedness and headaches.  Psychiatric/Behavioral:  Negative for confusion.   All other systems reviewed and are negative.  Physical Exam Updated Vital Signs BP (!) 129/94 (BP Location: Right Arm)   Pulse 83   Temp 98.6 F (37 C) (Oral)   Resp 18   SpO2 100%   Physical Exam Vitals and nursing note reviewed.  Constitutional:      General: She is not in acute distress.    Appearance:  Normal appearance. She is not ill-appearing, toxic-appearing or diaphoretic.  HENT:     Head: Normocephalic and atraumatic.     Nose: No nasal deformity.     Mouth/Throat:     Lips: Pink. No lesions.     Mouth: No injury, lacerations, oral lesions or angioedema.     Pharynx: Uvula midline. No uvula swelling.  Eyes:     General: Gaze aligned appropriately. No scleral icterus.       Right eye: No discharge.        Left eye: No discharge.      Conjunctiva/sclera: Conjunctivae normal.     Right eye: Right conjunctiva is not injected. No exudate or hemorrhage.    Left eye: Left conjunctiva is not injected. No exudate or hemorrhage. Cardiovascular:     Rate and Rhythm: Normal rate and regular rhythm.     Pulses: Normal pulses.          Radial pulses are 2+ on the right side and 2+ on the left side.       Dorsalis pedis pulses are 2+ on the right side and 2+ on the left side.     Heart sounds: Normal heart sounds, S1 normal and S2 normal. Heart sounds not distant. No murmur heard.   No friction rub. No gallop. No S3 or S4 sounds.  Pulmonary:     Effort: Pulmonary effort is normal. No accessory muscle usage or respiratory distress.     Breath sounds: Normal breath sounds. No stridor. No wheezing, rhonchi or rales.  Chest:     Chest wall: No tenderness.  Abdominal:     General: Abdomen is flat. Bowel sounds are normal. There is no distension.     Palpations: Abdomen is soft. There is no mass or pulsatile mass.     Tenderness: There is generalized abdominal tenderness and tenderness in the right upper quadrant and epigastric area. There is no right CVA tenderness, left CVA tenderness, guarding or rebound. Positive signs include Murphy's sign. Negative signs include McBurney's sign.  Musculoskeletal:     Right lower leg: No edema.     Left lower leg: No edema.  Skin:    General: Skin is warm and dry.     Coloration: Skin is not jaundiced or pale.     Findings: No bruising, erythema, lesion or rash.  Neurological:     General: No focal deficit present.     Mental Status: She is alert and oriented to person, place, and time.     GCS: GCS eye subscore is 4. GCS verbal subscore is 5. GCS motor subscore is 6.  Psychiatric:        Mood and Affect: Mood normal.        Behavior: Behavior normal. Behavior is cooperative.    ED Results / Procedures / Treatments   Labs (all labs ordered are listed, but only abnormal results are  displayed) Labs Reviewed  CBC WITH DIFFERENTIAL/PLATELET - Abnormal; Notable for the following components:      Result Value   WBC 14.0 (*)    Hemoglobin 10.5 (*)    MCH 23.3 (*)    MCHC 28.9 (*)    RDW 17.5 (*)    Neutro Abs 10.9 (*)    Abs Immature Granulocytes 0.11 (*)    All other components within normal limits  COMPREHENSIVE METABOLIC PANEL - Abnormal; Notable for the following components:   Glucose, Bld 110 (*)    Total Protein 8.2 (*)  Albumin 3.4 (*)    Total Bilirubin 0.2 (*)    All other components within normal limits  URINALYSIS, ROUTINE W REFLEX MICROSCOPIC - Abnormal; Notable for the following components:   Protein, ur 30 (*)    Leukocytes,Ua TRACE (*)    All other components within normal limits  LIPASE, BLOOD  I-STAT BETA HCG BLOOD, ED ([facility], [facility], [facility] ONLY)    EKG None  Radiology US Abdomen Limited  Result Date: 03/23/2021 CLINICAL DATA:  Right upper quadrant pain for 1 day. EXAM: ULTRASOUND ABDOMEN LIMITED RIGHT UPPER QUADRANT COMPARISON:  None. FINDINGS: Gallbladder: There is at least 1 shadowing gallstone measuring 1.7 cm. No gallbladder wall thickening. Positive sonographic Murphy sign. Common bile duct: Diameter: 0.5 cm, within normal limits Liver: No focal lesion identified. Mildly increased parenchymal echogenicity. Portal vein is patent on color Doppler imaging with normal direction of blood flow towards the liver. Other: None. IMPRESSION: 1. Cholelithiasis. No gallbladder wall thickening. Positive sonographic Murphy sign. Findings are technically indeterminate for acute cholecystitis. Further evaluation with CT or nuclear medicine HIDA scan may be performed. 2. Mildly increased liver parenchymal echogenicity which is nonspecific but may represent early fatty infiltration. Electronically Signed   By: Emmaline Kluver M.D.   On: 03/23/2021 16:32    Procedures Procedures   Medications Ordered in ED Medications  HYDROmorphone (DILAUDID) injection 1 mg (1  mg Intravenous Given 03/24/21 0744)    ED Course  I have reviewed the triage vital signs and the nursing notes.  Pertinent labs & imaging results that were available during my care of the patient were reviewed by me and considered in my medical decision making (see chart for details).    MDM Rules/Calculators/A&P                         This is a well appearing 30 year old female who presents with acute on chronic RUQ abdominal pain that is worse after meals. History concerning for biliary colic pain.  Vitals with initial mild tachycardia, afebrile, otherwise HD stable. Throughout time in the waiting room, tachycardia has improved with no intervention. Other vitals have remained stable. Exam with positive Murphy's sign and generalized abdominal tenderness.  I reviewed all labs and imaging: - Mild Leukocytosis, stable anemia present, LFTs normal, Lipase normal, Pregnancy negative, UA unremarkable. - RUQ ultrasound shows cholelithiasis. No gallbladder wall thickening. Positive sonographic Murphy sign.   Given patient's history, I suspect that the cause of her abdominal pain has been primarily due to cholelithiasis. She has remained afebrile, and she has no PE findings of bile duct obstruction or obstructive LFT pattern on labs. I have a low suspicion for cholecystitis, cholangitis, or choledocholithiasis.  She does still have significant abdominal tenderness to palpation. Plan to try and get pain under control. If pain can be controlled, I think she would be a good candidate for outpatient follow up with general surgery.   On reassessment, patient's pain has improved. I will provide patient with suggested dietary modifications as well as a outpatient follow up with general surgery to discuss possible cholecystectomy.   Final Clinical Impression(s) / ED Diagnoses Final diagnoses:  Calculus of gallbladder without cholecystitis without obstruction    Rx / DC Orders ED Discharge Orders      None        Claudie Leach, PA-C 03/24/21 0840    Gilda Crease, MD 03/24/21 1524

## 2021-05-07 ENCOUNTER — Other Ambulatory Visit: Payer: Self-pay | Admitting: General Surgery

## 2021-05-07 ENCOUNTER — Ambulatory Visit: Payer: Self-pay | Admitting: General Surgery

## 2021-05-07 NOTE — H&P (Signed)
Chief Complaint: No chief complaint on file.       History of Present Illness: Peggy Patel is a 30 y.o. female who is seen today as an office consultation at the request of Dr. Blinda Leatherwood for evaluation of No chief complaint on file. .     Patient is a 30 year old female who follows up secondary to abdominal pain.  Patient states that she had significant abdominal pain for several years.  She states that recently on her most recent trip to the ER she had some epigastric upper quadrant abdominal pain.  She feels that there is no preceding events.  She does state that she has a diet high in spicy, fatty foods.  Patient underwent ultrasound in the ER was found to have multiple gallstones.  Patient without signs of cholecystitis on ultrasound.  Patient did have a leukocytosis.  Patient LFTs were within normal limits.   I did review her laboratory studies and ultrasound personally.   Patient's had no previous abdominal surgery.     Review of Systems: A complete review of systems was obtained from the patient.  I have reviewed this information and discussed as appropriate with the patient.  See HPI as well for other ROS.   Review of Systems  Constitutional: Negative for fever.  HENT: Negative for congestion.   Eyes: Negative for blurred vision.  Respiratory: Negative for cough, shortness of breath and wheezing.   Cardiovascular: Negative for chest pain and palpitations.  Gastrointestinal: Positive for abdominal pain and nausea. Negative for heartburn.  Genitourinary: Negative for dysuria.  Musculoskeletal: Negative for myalgias.  Skin: Negative for rash.  Neurological: Negative for dizziness and headaches.  Psychiatric/Behavioral: Negative for depression and suicidal ideas.  All other systems reviewed and are negative.       Medical History: Past Medical History Past Medical History: Diagnosis Date  Anemia    Anxiety    Sleep apnea    Thyroid disease        There is no  problem list on file for this patient.     Past Surgical History History reviewed. No pertinent surgical history.     Allergies Allergies Allergen Reactions  Penicillins Hives  Tomato Hives      No current outpatient medications on file prior to visit.   No current facility-administered medications on file prior to visit.     Family History Family History Problem Relation Age of Onset  High blood pressure (Hypertension) Mother        Social History   Tobacco Use Smoking Status Never Smokeless Tobacco Never     Social History Social History    Socioeconomic History  Marital status: Single Tobacco Use  Smoking status: Never  Smokeless tobacco: Never Vaping Use  Vaping Use: Never used Substance and Sexual Activity  Alcohol use: Never  Drug use: Never  Sexual activity: Never      Objective:     Vitals:   05/07/21 1331 BP: (!) 162/90 Pulse: 110 Temp: 37.1 C (98.7 F) SpO2: 97% Weight: (!) 158.8 kg (350 lb 3.2 oz) Height: 170.2 cm (5\' 7" )   Body mass index is 54.85 kg/m.   Physical Exam Constitutional:      Appearance: Normal appearance.  HENT:     Head: Normocephalic and atraumatic.     Mouth/Throat:     Mouth: Mucous membranes are moist.     Pharynx: Oropharynx is clear.  Eyes:     General: No scleral icterus.    Pupils: Pupils are equal,  round, and reactive to light.  Cardiovascular:     Rate and Rhythm: Normal rate and regular rhythm.     Pulses: Normal pulses.     Heart sounds: No murmur heard.   No friction rub. No gallop.  Pulmonary:     Effort: Pulmonary effort is normal. No respiratory distress.     Breath sounds: Normal breath sounds. No stridor.  Abdominal:     General: Abdomen is flat.     Tenderness: There is no abdominal tenderness.     Hernia: No hernia is present.  Musculoskeletal:        General: No swelling.  Skin:    General: Skin is warm.  Neurological:     General: No focal deficit present.     Mental  Status: She is alert and oriented to person, place, and time. Mental status is at baseline.  Psychiatric:        Mood and Affect: Mood normal.        Thought Content: Thought content normal.        Judgment: Judgment normal.        Assessment and Plan: Diagnoses and all orders for this visit:   Symptomatic cholelithiasis     Peggy Patel is a 30 y.o. female    1.  We will proceed to the OR for a lap cholecystectomy. 2. All risks and benefits were discussed with the patient to generally include: infection, bleeding, possible need for post op ERCP, damage to the bile ducts, and bile leak. Alternatives were offered and described.  All questions were answered and the patient voiced understanding of the procedure and wishes to proceed at this point with a laparoscopic cholecystectomy           No follow-ups on file.   Axel Filler, MD, [facility], [place] [facility]

## 2021-06-12 ENCOUNTER — Ambulatory Visit (HOSPITAL_COMMUNITY)
Admission: RE | Admit: 2021-06-12 | Payer: No Typology Code available for payment source | Source: Home / Self Care | Admitting: General Surgery

## 2021-06-12 ENCOUNTER — Encounter (HOSPITAL_COMMUNITY): Admission: RE | Payer: Self-pay | Source: Home / Self Care

## 2021-06-12 SURGERY — LAPAROSCOPIC CHOLECYSTECTOMY
Anesthesia: General

## 2021-07-14 ENCOUNTER — Emergency Department (HOSPITAL_COMMUNITY)
Admission: EM | Admit: 2021-07-14 | Discharge: 2021-07-14 | Disposition: A | Discharge status: Home / Self Care | Payer: No Typology Code available for payment source | Attending: Emergency Medicine | Admitting: Emergency Medicine

## 2021-07-14 ENCOUNTER — Encounter (HOSPITAL_COMMUNITY): Payer: Self-pay

## 2021-07-14 ENCOUNTER — Emergency Department (HOSPITAL_COMMUNITY): Payer: No Typology Code available for payment source

## 2021-07-14 DIAGNOSIS — J069 Acute upper respiratory infection, unspecified: Secondary | ICD-10-CM | POA: Diagnosis not present

## 2021-07-14 DIAGNOSIS — Z20822 Contact with and (suspected) exposure to covid-19: Secondary | ICD-10-CM | POA: Diagnosis not present

## 2021-07-14 DIAGNOSIS — J019 Acute sinusitis, unspecified: Secondary | ICD-10-CM | POA: Insufficient documentation

## 2021-07-14 DIAGNOSIS — R55 Syncope and collapse: Secondary | ICD-10-CM | POA: Insufficient documentation

## 2021-07-14 DIAGNOSIS — R059 Cough, unspecified: Secondary | ICD-10-CM | POA: Diagnosis present

## 2021-07-14 DIAGNOSIS — R000 Tachycardia, unspecified: Secondary | ICD-10-CM | POA: Insufficient documentation

## 2021-07-14 DIAGNOSIS — N9489 Other specified conditions associated with female genital organs and menstrual cycle: Secondary | ICD-10-CM | POA: Insufficient documentation

## 2021-07-14 LAB — CBC WITH DIFFERENTIAL/PLATELET
Abs Immature Granulocytes: 0.07 K/uL (ref 0.00–0.07)
Basophils Absolute: 0.1 K/uL (ref 0.0–0.1)
Basophils Relative: 1 %
Eosinophils Absolute: 0.2 K/uL (ref 0.0–0.5)
Eosinophils Relative: 1 %
HCT: 39.8 % (ref 36.0–46.0)
Hemoglobin: 12.2 g/dL (ref 12.0–15.0)
Immature Granulocytes: 0 %
Lymphocytes Relative: 22 %
Lymphs Abs: 3.7 K/uL (ref 0.7–4.0)
MCH: 25.9 pg — ABNORMAL LOW (ref 26.0–34.0)
MCHC: 30.7 g/dL (ref 30.0–36.0)
MCV: 84.5 fL (ref 80.0–100.0)
Monocytes Absolute: 1 K/uL (ref 0.1–1.0)
Monocytes Relative: 6 %
Neutro Abs: 11.9 K/uL — ABNORMAL HIGH (ref 1.7–7.7)
Neutrophils Relative %: 70 %
Platelets: 420 K/uL — ABNORMAL HIGH (ref 150–400)
RBC: 4.71 MIL/uL (ref 3.87–5.11)
RDW: 15.9 % — ABNORMAL HIGH (ref 11.5–15.5)
WBC: 16.9 K/uL — ABNORMAL HIGH (ref 4.0–10.5)
nRBC: 0 % (ref 0.0–0.2)

## 2021-07-14 LAB — I-STAT BETA HCG BLOOD, ED (MC, WL, AP ONLY): I-stat hCG, quantitative: <5 m[IU]/mL (ref <5)

## 2021-07-14 LAB — BASIC METABOLIC PANEL
Anion gap: 12 (ref 5–15)
BUN: 10 mg/dL (ref 6–20)
CO2: 25 mmol/L (ref 22–32)
Calcium: 9.1 mg/dL (ref 8.9–10.3)
Chloride: 101 mmol/L (ref 98–111)
Creatinine, Ser: 0.79 mg/dL (ref 0.44–1.00)
GFR, Estimated: >60 mL/min (ref >60)
Glucose, Bld: 116 mg/dL — ABNORMAL HIGH (ref 70–99)
Potassium: 4.3 mmol/L (ref 3.5–5.1)
Sodium: 138 mmol/L (ref 135–145)

## 2021-07-14 LAB — RESP PANEL BY RT-PCR (FLU A&B, COVID) ARPGX2
Influenza A by PCR: NEGATIVE
Influenza B by PCR: NEGATIVE
SARS Coronavirus 2 by RT PCR: NEGATIVE

## 2021-07-14 MED ORDER — ACETAMINOPHEN 500 MG PO TABS
1000.0000 mg | ORAL_TABLET | Freq: Once | ORAL | Status: AC
  Administered 2021-07-14: 1000 mg via ORAL
  Filled 2021-07-14: qty 2

## 2021-07-14 MED ORDER — DOXYCYCLINE HYCLATE 100 MG PO CAPS: 100.0000 mg | ORAL_CAPSULE | Freq: Two times a day (BID) | ORAL | 0 refills | Status: DC

## 2021-07-14 NOTE — ED Provider Triage Note (Signed)
Emergency Medicine Provider Triage Evaluation Note  Peggy Patel , a 31 y.o. female  was evaluated in triage.  Pt complains of facial pressure and cough.  She states it started as a cold about a month ago.  (Jan 1st).   She reports that her nose is still congested, She is coughing up mucus, She reports that she had gotten a little better and then got worse.    She reports light headed with standing.     Physical Exam  BP (!) 127/92 (BP Location: Right Arm)    Pulse (!) 126    Temp 98.9 F (37.2 C) (Oral)    Resp 17    SpO2 93%  Gen:   Awake, no distress  Resp:  Normal effort  MSK:   Moves extremities without difficulty  Other:  Rhonchi bilaterally   Medical Decision Making  Medically screening exam initiated at 1:10 PM.  Appropriate orders placed.  Jiayi Underwood was informed that the remainder of the evaluation will be completed by another provider, this initial triage assessment does not replace that evaluation, and the importance of remaining in the ED until their evaluation is complete.  Will check EKG, basic labs, flu/covid.    Cristina Gong, [place] 07/14/21 1316

## 2021-07-14 NOTE — ED Notes (Signed)
RN reviewed discharge instructions w/ pt. Prescriptions reviewed, pt had no further questions

## 2021-07-14 NOTE — Discharge Instructions (Addendum)
Stay well-hydrated. Take Tylenol every 4 as needed for body aches, pain or fevers. If you feel lightheaded please sit down or lie down in a safe spot. Take antibiotics as prescribed. Return for new or worsening signs or symptoms especially chest pain, passing out, bleeding, shortness of breath, other.

## 2021-07-14 NOTE — ED Notes (Signed)
Pt given water 

## 2021-07-14 NOTE — ED Provider Notes (Signed)
[facility] EMERGENCY DEPARTMENT Provider Note   CSN: [id number] Arrival date & time: 07/14/21  1248     History  Chief Complaint  Patient presents with   URI    Peggy Patel is a 31 y.o. female.  Patient presents with general malaise, cough, lightheadedness gradually worsening for over a few weeks.  Yesterday started having worsening sinus pressure and discomfort around her eye area.  Patient's had worsening yellow and green drainage both nasal and coughing.  No abdominal pain, no bleeding.  Patient was very lightheaded this morning.  Decreased appetite recently.  No shortness of breath, no blood clot history.  Patient's birth control however non-smoker.  Intermittent fevers.  Prediabetic history.      Home Medications Prior to Admission medications   Medication Sig Start Date End Date Taking? Authorizing Provider  cyclobenzaprine (FLEXERIL) 10 MG tablet Take 1 tablet (10 mg total) by mouth at bedtime. Patient not taking: Reported on 03/24/2021 04/24/20   Meccariello, Bernita Raisin, DO  naproxen (NAPROSYN) 375 MG tablet Take 1 tablet (375 mg total) by mouth 2 (two) times daily. Patient not taking: Reported on 03/24/2021 02/23/20   Dorie Rank, MD  Norethindrone Acetate-Ethinyl Estrad-FE (LOESTRIN 24 FE) 1-20 MG-MCG(24) tablet Take 1 tablet by mouth daily. Patient not taking: Reported on 03/24/2021 10/02/19   Lattie Haw, MD      Allergies    Penicillins and Tomato    Review of Systems   Review of Systems  Constitutional:  Negative for chills and fever.  HENT:  Positive for congestion.   Eyes:  Negative for visual disturbance.  Respiratory:  Positive for cough. Negative for shortness of breath.   Cardiovascular:  Negative for chest pain.  Gastrointestinal:  Positive for nausea. Negative for abdominal pain and vomiting.  Genitourinary:  Negative for dysuria and flank pain.  Musculoskeletal:  Negative for back pain, neck pain and neck stiffness.  Skin:   Negative for rash.  Neurological:  Positive for light-headedness. Negative for headaches.   Physical Exam Updated Vital Signs BP (!) 127/92 (BP Location: Right Arm)    Pulse (!) 126    Temp 98.9 F (37.2 C) (Oral)    Resp 17    Ht 5\' 7"  (1.702 m)    Wt (!) 157 kg    SpO2 93%    BMI 54.21 kg/m  Physical Exam Vitals and nursing note reviewed.  Constitutional:      General: She is not in acute distress.    Appearance: She is well-developed.  HENT:     Head: Normocephalic and atraumatic.     Comments: Patient has mild discomfort left maxillary sinus region, no significant periorbital edema, full extraocular muscle function without discomfort.    Mouth/Throat:     Mouth: Mucous membranes are moist.  Eyes:     General:        Right eye: No discharge.        Left eye: No discharge.     Conjunctiva/sclera: Conjunctivae normal.  Neck:     Trachea: No tracheal deviation.  Cardiovascular:     Rate and Rhythm: Regular rhythm. Tachycardia present.     Heart sounds: No murmur heard. Pulmonary:     Effort: Pulmonary effort is normal.     Breath sounds: Normal breath sounds.  Abdominal:     General: There is no distension.     Palpations: Abdomen is soft.     Tenderness: There is no abdominal tenderness. There is no guarding.  Musculoskeletal:        General: No swelling or tenderness.     Cervical back: Normal range of motion and neck supple. No rigidity.  Skin:    General: Skin is warm.     Capillary Refill: Capillary refill takes less than 2 seconds.     Findings: No rash.  Neurological:     General: No focal deficit present.     Mental Status: She is alert.     Cranial Nerves: No cranial nerve deficit.  Psychiatric:        Mood and Affect: Mood normal.    ED Results / Procedures / Treatments   Labs (all labs ordered are listed, but only abnormal results are displayed) Labs Reviewed  CBC WITH DIFFERENTIAL/PLATELET - Abnormal; Notable for the following components:      Result  Value   WBC 16.9 (*)    MCH 25.9 (*)    RDW 15.9 (*)    Platelets 420 (*)    Neutro Abs 11.9 (*)    All other components within normal limits  BASIC METABOLIC PANEL - Abnormal; Notable for the following components:   Glucose, Bld 116 (*)    All other components within normal limits  RESP PANEL BY RT-PCR (FLU A&B, COVID) ARPGX2  I-STAT BETA HCG BLOOD, ED (MC, WL, AP ONLY)    EKG EKG Interpretation  Date/Time:  Monday July 14 2021 13:22:29 EST Ventricular Rate:  132 PR Interval:  128 QRS Duration: 64 QT Interval:  286 QTC Calculation: 423 R Axis:   117 Text Interpretation: Sinus tachycardia Right axis deviation Possible Right ventricular hypertrophy Abnormal ECG No previous ECGs available Confirmed by Elnora Morrison [id number]) on 07/14/2021 3:46:17 PM  Radiology DG Chest 2 View  Result Date: 07/14/2021 CLINICAL DATA:  Cough, dizziness EXAM: CHEST - 2 VIEW COMPARISON:  None. FINDINGS: The heart size and mediastinal contours are within normal limits. Both lungs are clear. The visualized skeletal structures are unremarkable. IMPRESSION: No active cardiopulmonary disease. Electronically Signed   By: Elmer Picker M.D.   On: 07/14/2021 13:45    Procedures Procedures    Medications Ordered in ED Medications  acetaminophen (TYLENOL) tablet 1,000 mg (1,000 mg Oral Given 07/14/21 1537)    ED Course/ Medical Decision Making/ A&P                           Medical Decision Making Risk OTC drugs. Prescription drug management.   Patient presents with worsening signs and symptoms consistent with infection given productive cough, green drainage, intermittent fevers which clinically is led to dehydration and general weakness with near syncopal episode.  Patient exam is mild dehydration plan for oral fluids, Tylenol for general discomfort and recheck vitals.  Initially vital signs blood pressure 123456 systolic, pulse rate [id number] likely secondary to dehydration, discomfort however will  ensure improved.  Patient has no history of blood clots, no shortness of breath and says she is not a cigarette smoker and is not currently on birth control.  EKG reviewed showing sinus tachycardia.  No chest pain to suggest ACS picture especially given young age and other infectious symptoms.  Blood work ordered and reviewed showing normal hemoglobin, electrolytes unremarkable, white blood cell count 16.9 with neutrophil shift consistent with sinus/respiratory infection.  Chest x-ray reviewed independently no infiltrate or cardiomegaly, pregnancy test negative.  On reevaluation patient felt improved, tolerated 2 glasses of water, heart rate improved to 110.  Patient stable for close  outpatient follow-up.  Doxycycline given for sinusitis/recurrent respiratory infection symptoms.  At this time no indication for admission given tolerating oral liquids, improved clinically and vital signs improving.        Final Clinical Impression(s) / ED Diagnoses Final diagnoses:  Acute sinusitis with symptoms > 10 days  Acute upper respiratory infection  Near syncope    Rx / DC Orders ED Discharge Orders     None         Elnora Morrison, MD 07/14/21 [phone]

## 2021-07-14 NOTE — ED Triage Notes (Signed)
Pt reports malaise and cough since 1/1, worse recently. States has dizziness on standing, and reports recent near syncopal episode this AM.

## 2021-10-17 ENCOUNTER — Other Ambulatory Visit (HOSPITAL_COMMUNITY)
Admission: RE | Admit: 2021-10-17 | Discharge: 2021-10-17 | Disposition: A | Discharge status: Home / Self Care | Payer: 59 | Source: Ambulatory Visit | Attending: Family Medicine | Admitting: Family Medicine

## 2021-10-17 ENCOUNTER — Ambulatory Visit (INDEPENDENT_AMBULATORY_CARE_PROVIDER_SITE_OTHER): Payer: 59 | Admitting: Family Medicine

## 2021-10-17 ENCOUNTER — Encounter: Payer: Self-pay | Admitting: Family Medicine

## 2021-10-17 VITALS — BP 119/77 | HR 100 | Ht 67.0 in | Wt 362.4 lb

## 2021-10-17 DIAGNOSIS — R7303 Prediabetes: Secondary | ICD-10-CM

## 2021-10-17 DIAGNOSIS — Z124 Encounter for screening for malignant neoplasm of cervix: Secondary | ICD-10-CM

## 2021-10-17 DIAGNOSIS — Z113 Encounter for screening for infections with a predominantly sexual mode of transmission: Secondary | ICD-10-CM | POA: Diagnosis not present

## 2021-10-17 DIAGNOSIS — E669 Obesity, unspecified: Secondary | ICD-10-CM

## 2021-10-17 DIAGNOSIS — N970 Female infertility associated with anovulation: Secondary | ICD-10-CM | POA: Diagnosis not present

## 2021-10-17 DIAGNOSIS — R030 Elevated blood-pressure reading, without diagnosis of hypertension: Secondary | ICD-10-CM

## 2021-10-17 DIAGNOSIS — Z0000 Encounter for general adult medical examination without abnormal findings: Secondary | ICD-10-CM

## 2021-10-17 DIAGNOSIS — F39 Unspecified mood [affective] disorder: Secondary | ICD-10-CM

## 2021-10-17 DIAGNOSIS — N926 Irregular menstruation, unspecified: Secondary | ICD-10-CM | POA: Diagnosis not present

## 2021-10-17 DIAGNOSIS — Z6841 Body Mass Index (BMI) 40.0 and over, adult: Secondary | ICD-10-CM

## 2021-10-17 LAB — POCT WET PREP (WET MOUNT)
Clue Cells Wet Prep Whiff POC: NEGATIVE
Trichomonas Wet Prep HPF POC: ABSENT

## 2021-10-17 MED ORDER — ADULT BLOOD PRESSURE CUFF LG KIT: PACK | 0 refills | Status: DC

## 2021-10-17 NOTE — Patient Instructions (Addendum)
Thank you for coming to see me today. It was a pleasure.  ? ?We will get some labs today.  If they are abnormal or we need to do something about them, I will call you.  If they are normal, I will send you a message on MyChart (if it is active) or a letter in the mail.  If you don't hear from [place] in 2 weeks, please call the office at the number below.  ? ?Will MyChart you the results of swabs today.  If needing treatment will send in prescription to your pharmacy.  ? ?Please go to [facility]  ?Walk in clinic on 2nd floor ?[address] [place], [place] [address] ?Open 24 hours ?Phone: [phone] ? ?Referral to psychiatry ?Referral to CCM to help connect you with therapy resources\ ? ?Your blood pressure is elevated.  Please check blood pressure at home 1-2 times a week and write down in journal. Bring to clinic at your next visit. ? ?Please follow-up with PCP in 1-2 weeks ? ?If you have any questions or concerns, please do not hesitate to call the office at [phone]. ? ?Best,  ? ?Carollee Leitz, MD   ? ? ?Therapy and Counseling Resources ?Most providers on this list will take Medicaid. Patients with commercial insurance or Medicare should contact their insurance company to get a list of in network providers. ? ?[facility] ? [address] [place], [place] [address]      [phone] ? ?[facility] ?[place], [place] [address] ?[phone] ? ?[facility] ?[address], [place],  [address]       [phone]    ? ? ?[facility] ?[address], [place], [place] ? ?[facility]:  [address] [place] ? ?[facility]:  ?[place]ton Golds [phone] ? ?[employer] (under & uninsured) ?[address] [place] [phone]    [email or web address]   ? ?[facility] ?[place]     Phone:  [phone]     [place]  [phone]  ? ?[place] ?[address] [place], [place] ext [phone] ? ?[facility]: [place], [place], [place]  ? ?[facility] ([place] therapist) ?[address]   [place] [address]    [phone]   ? ?[facility]   ?[address] [place],  [place], [place]  ? ?[facility]  ?[address] [place]  [phone] ? ?[facility]  ?[place], [place]        [phone] ? ?Open Access/Walk In Clinic under & uninsured ?Beverly Sessions, To schedule an appointment call [phone]- [phone] ?[address] [place] [phone]):  Molli Knock - Fri from 8 AM - 3 PM ?Moving June 1 to [employer] at [facility] [address], [place] [address] ? ?[facility] [place],  ?([place])   [place]: [phone]) 8:30 - 12; 1 - 2:30 ? ?[facility] [place],  ?[address] [place]    ([phone]):8:30 - 12; 2 - 3PM ? ?[facility],  ?[place],  [employer]  [place]; [phone]):   Mon - Fri 8 AM - 5 PM ? ?[facility] ?[place]  MWF 12:30 to 3:00 or call to schedule an appointment  [phone] ? ?Specific Provider options ?Psychology Today  https://www.psychologytoday.com/us ?click on find a therapist  ?enter your zip code ?left side and select or tailor a therapist for your specific need.  ? ?[facility] Provider Directory ?[email or web address]  (Medicaid)   Follow all drop down to find a provider ? ?Social Support program ?[place] ?3362481434 or [email or web address].[email or web address] ?[address], [place], [place] Recovery support and educational  ? ?In home counseling ?[place] ?Telephone: [phone]  office in [facility] [email or web address]   ?Does not take reg. Medicaid or Medicare ?private insurance BCCS, [place] health Choice, [place], [place], [place], [place], [place] Choice ? ?24- Hour Availability:  ?[place] or [phone] ? ?[facility] of the [employer] [phone] ? ?[facility]  [phone]  ? ?[place]  [phone] (after hours) ? ?Therapeutic Alternative/Mobile Crisis   9206950784 ? ?[place] National Suicide Hotline  873-388-3454 [place]antina Monks) ? ?Call 911 or go to emergency room ? ?[employer]  [phone]);  [place] and [place]  ? ?[facility]  ?[phone]); [place], [place], [place], [place], [place], [place], [place]  ?

## 2021-10-17 NOTE — Progress Notes (Signed)
    SUBJECTIVE:   CHIEF COMPLAINT / HPI: PAP, irregular periods  Reports irregular periods and history of PCOS, hyperactive thyroid.  Has not been sexually active in 2 years.  Not on birthcontrol.  Reports increase in weight gain.    Mood disorder Reports history of MDD, PTSD, Bipolar.  Not currently on medications.  Denies any SI/HI.  No history of suicide attempt.    PERTINENT  PMH / PSH:  MDD Bipolar PTSD PCOS Hyperactive Thyroid  OBJECTIVE:   BP 119/77   Pulse 100   Ht 5\' 7"  (1.702 m)   Wt (!) 362 lb 6.4 oz (164.4 kg)   SpO2 97%   BMI 56.76 kg/m    General: Alert, no acute distress Pelvic Exam chaperoned by CMA April        External: normal female genitalia without lesions or masses        Vagina: normal without lesions or masses        Cervix: normal without lesions or masses          ASSESSMENT/PLAN:   Cervical cancer screening UPT negative Declined for contraception, not sexually active Pap smear: performed Samples for Wet prep, GC/Chlamydia obtained HIV, RPR, Hep C screening today Follow up with results  Anovulatory (dysfunctional uterine) bleeding Patient not taking OCP, does want the increase in weight as she struggles with this.  Given history of PCOS she would benefit from weight loss to help with regulation of menses.  Last TSH mildly elevated Will repeat TSH today Follow up with PCP to discuss options for AUB  Obesity She is open to discussion of weigh loss. Encouraged to follow up with PCP to discuss options.  She would likely benefit from Bariatric surgery and nutrition consult   Mood disorder (HCC) PHQ9 score 19, GAD score 21, MDQ screening positive CCM referral to help with connection to psychiatry services Referral to psychiatry Mental health resources provided Follow up with PCP in 1-2 weeks Strict return precautions provided     May, MD [facility]

## 2021-10-18 LAB — COMPREHENSIVE METABOLIC PANEL
ALT: 16 IU/L (ref 0–32)
AST: 18 IU/L (ref 0–40)
Albumin/Globulin Ratio: 1.1 — ABNORMAL LOW (ref 1.2–2.2)
Albumin: 3.9 g/dL (ref 3.9–5.0)
Alkaline Phosphatase: 70 IU/L (ref 44–121)
BUN/Creatinine Ratio: 12 (ref 9–23)
BUN: 8 mg/dL (ref 6–20)
Bilirubin Total: <0.2 mg/dL (ref 0.0–1.2)
CO2: 21 mmol/L (ref 20–29)
Calcium: 9 mg/dL (ref 8.7–10.2)
Chloride: 101 mmol/L (ref 96–106)
Creatinine, Ser: 0.65 mg/dL (ref 0.57–1.00)
Globulin, Total: 3.7 g/dL (ref 1.5–4.5)
Glucose: 121 mg/dL — ABNORMAL HIGH (ref 70–99)
Potassium: 3.9 mmol/L (ref 3.5–5.2)
Sodium: 138 mmol/L (ref 134–144)
Total Protein: 7.6 g/dL (ref 6.0–8.5)
eGFR: 122 mL/min/{1.73_m2} (ref >59)

## 2021-10-18 LAB — LIPID PANEL
Chol/HDL Ratio: 6.3 ratio — ABNORMAL HIGH (ref 0.0–4.4)
Cholesterol, Total: 126 mg/dL (ref 100–199)
HDL: 20 mg/dL — ABNORMAL LOW (ref >39)
LDL Chol Calc (NIH): 81 mg/dL (ref 0–99)
Triglycerides: 136 mg/dL (ref 0–149)
VLDL Cholesterol Cal: 25 mg/dL (ref 5–40)

## 2021-10-18 LAB — HCV AB W REFLEX TO QUANT PCR: HCV Ab: NONREACTIVE

## 2021-10-18 LAB — HCV INTERPRETATION

## 2021-10-18 LAB — HIV ANTIBODY (ROUTINE TESTING W REFLEX): HIV Screen 4th Generation wRfx: NONREACTIVE

## 2021-10-18 LAB — TSH: TSH: 11.9 u[IU]/mL — ABNORMAL HIGH (ref 0.450–4.500)

## 2021-10-20 LAB — CYTOLOGY - PAP
Chlamydia: NEGATIVE
Comment: NEGATIVE
Comment: NEGATIVE
Comment: NORMAL
Diagnosis: NEGATIVE
Neisseria Gonorrhea: NEGATIVE
Trichomonas: NEGATIVE

## 2021-10-21 ENCOUNTER — Encounter: Payer: Self-pay | Admitting: Family Medicine

## 2021-10-21 ENCOUNTER — Other Ambulatory Visit: Payer: Self-pay | Admitting: Family Medicine

## 2021-10-21 DIAGNOSIS — E669 Obesity, unspecified: Secondary | ICD-10-CM | POA: Insufficient documentation

## 2021-10-21 DIAGNOSIS — Z124 Encounter for screening for malignant neoplasm of cervix: Secondary | ICD-10-CM | POA: Insufficient documentation

## 2021-10-21 DIAGNOSIS — F39 Unspecified mood [affective] disorder: Secondary | ICD-10-CM | POA: Insufficient documentation

## 2021-10-21 DIAGNOSIS — R030 Elevated blood-pressure reading, without diagnosis of hypertension: Secondary | ICD-10-CM | POA: Insufficient documentation

## 2021-10-21 DIAGNOSIS — E039 Hypothyroidism, unspecified: Secondary | ICD-10-CM

## 2021-10-21 LAB — T PALLIDUM ANTIBODY, EIA: T pallidum Antibody, EIA: NEGATIVE

## 2021-10-21 LAB — RPR W/REFLEX TO TREPSURE: RPR: NONREACTIVE

## 2021-10-21 NOTE — Assessment & Plan Note (Addendum)
She is open to discussion of weigh loss. Encouraged to follow up with PCP to discuss options.  She would likely benefit from Bariatric surgery and nutrition consult ?CMet, Lipid, A1c today ? ?

## 2021-10-21 NOTE — Assessment & Plan Note (Signed)
Patient not taking OCP, does want the increase in weight as she struggles with this.  Given history of PCOS she would benefit from weight loss to help with regulation of menses.  Last TSH mildly elevated ?Will repeat TSH today ?Follow up with PCP to discuss options for AUB ?

## 2021-10-21 NOTE — Assessment & Plan Note (Signed)
Initially elevated.  Repeat wnl. ?Monitor BP at home, record reading and follow up with PCP in 1-2 weeks ? ?

## 2021-10-21 NOTE — Assessment & Plan Note (Signed)
UPT negative ?Declined for contraception, not sexually active ?Pap smear: performed ?Samples for Wet prep, GC/Chlamydia obtained ?HIV, RPR, Hep C screening today ?Follow up with results ?

## 2021-10-21 NOTE — Assessment & Plan Note (Signed)
PHQ9 score 19, GAD score 21, MDQ screening positive ?CCM referral to help with connection to psychiatry services ?Referral to psychiatry ?Mental health resources provided ?Follow up with PCP in 1-2 weeks ?Strict return precautions provided ?

## 2021-10-22 ENCOUNTER — Other Ambulatory Visit: Payer: Self-pay | Admitting: Family Medicine

## 2021-10-22 DIAGNOSIS — E039 Hypothyroidism, unspecified: Secondary | ICD-10-CM

## 2021-10-22 MED ORDER — LEVOTHYROXINE SODIUM 100 MCG PO TABS: 100.0000 ug | ORAL_TABLET | ORAL | 0 refills | Status: DC

## 2021-10-22 NOTE — Progress Notes (Signed)
Called patient to discuss results of recent blood work.  Symptomatic hypothyroidism.  Start levothyroxine 100 mcg daily.  Repeat TSH in 4 to 6 weeks.  Patient has follow-up appointment with PCP June 2.   ?Future orders for TSH placed.  Patient aware to make lab appointment few days prior to PCP appointment. ? ?Dana Allan, MD ?Family Medicine Residency   ?

## 2021-10-23 ENCOUNTER — Telehealth: Payer: Self-pay | Admitting: *Deleted

## 2021-10-23 LAB — T3, FREE: T3, Free: 3.3 pg/mL (ref 2.0–4.4)

## 2021-10-23 LAB — SPECIMEN STATUS REPORT

## 2021-10-23 NOTE — Chronic Care Management (AMB) (Signed)
  Care Management   Note  10/23/2021 Name: Peggy Patel MRN: [id number] DOB: 1990/12/01  Peggy Patel is a 31 y.o. year old female who is a primary care patient of Towanda Octave, MD. I reached out to [employer] by phone today offer care coordination services.   Ms. Cake was given information about care management services today including:  Care management services include personalized support from designated clinical staff supervised by her physician, including individualized plan of care and coordination with other care providers 24/7 contact phone numbers for assistance for urgent and routine care needs. The patient may stop care management services at any time by phone call to the office staff.  Patient agreed to services and verbal consent obtained.   Follow up plan: Telephone appointment with care management team member scheduled for:10/29/21  [facility], Embedded Care Coordination [facility]  Care Management  Direct Dial: [phone]

## 2021-10-29 ENCOUNTER — Telehealth: Payer: 59 | Admitting: Licensed Clinical Social Worker

## 2021-11-07 ENCOUNTER — Encounter: Payer: Self-pay | Admitting: Family Medicine

## 2021-11-07 ENCOUNTER — Telehealth (INDEPENDENT_AMBULATORY_CARE_PROVIDER_SITE_OTHER): Payer: 59 | Admitting: Family Medicine

## 2021-11-07 DIAGNOSIS — N970 Female infertility associated with anovulation: Secondary | ICD-10-CM

## 2021-11-07 DIAGNOSIS — F39 Unspecified mood [affective] disorder: Secondary | ICD-10-CM

## 2021-11-07 MED ORDER — MEGESTROL ACETATE 40 MG PO TABS: 40.0000 mg | ORAL_TABLET | Freq: Two times a day (BID) | ORAL | 0 refills | Status: AC

## 2021-11-07 NOTE — Assessment & Plan Note (Addendum)
Pt unable to make it to office today so visit switched to virtual visit. AUB in setting of PCOS and hypothyroidism. Recommended starting Megace 40mg  BID for 10 days and starting levothyroxine which pt has not yet collected from pharmacy. Pt should follow up in the clinic for further work up and lab work. Strict ER precautions provided to pt.

## 2021-11-07 NOTE — Progress Notes (Signed)
[facility] Telemedicine Visit  Patient consented to have virtual visit and was identified by name and date of birth. 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 She reports menorrhagia since 27th May which is "super heavy" and passing clots which are "huge"-the size of a quarter-palm of hand. She is using 6-7 sanitary pads a day. Pt feels dizzy, this is normal for her when she gets heavy periods. Has not started levothyroxine due to transport issues. Denies being on birth control. Pt is not sexually active.   Flowsheet Row Video Visit from 11/07/2021 in [facility]  PHQ-9 Total Score 21       ROS: per HPI  Pertinent PMHx: PCOS, mood disorder   Exam:  Ht 5\' 7"  (1.702 m)   Wt (!) 364 lb (165.1 kg) Comment: pt reports  BMI 57.01 kg/m   Respiratory: speaking in full sentences   Assessment/Plan:  Mood disorder (HCC) PHQ 21 today. Denies SI. Referred to psychiatry at previous visit. Awaiting referral.  Anovulatory (dysfunctional uterine) bleeding Pt unable to make it to office today so visit switched to virtual visit. AUB in setting of PCOS and hypothyroidism. Recommended starting Megace 40mg  BID for 10 days and starting levothyroxine which pt has not yet collected from pharmacy. Pt should follow up in the clinic for further work up and lab work. Strict ER precautions provided to pt.     Time spent during visit with patient: 10 minutes

## 2021-11-07 NOTE — Assessment & Plan Note (Signed)
PHQ 21 today. Denies SI. Referred to psychiatry at previous visit. Awaiting referral.

## 2021-11-11 ENCOUNTER — Encounter: Payer: Self-pay | Admitting: *Deleted

## 2023-01-22 IMAGING — US US ABDOMEN LIMITED
1 series · 14 of 25 positions shown · non-contrast
Comparison: None.

CLINICAL DATA: Right upper quadrant pain for 1 day.

EXAM:
ULTRASOUND ABDOMEN LIMITED RIGHT UPPER QUADRANT

[Series 1: us abdomen limited · 14 of 46 slices shown]
[im 1/46]
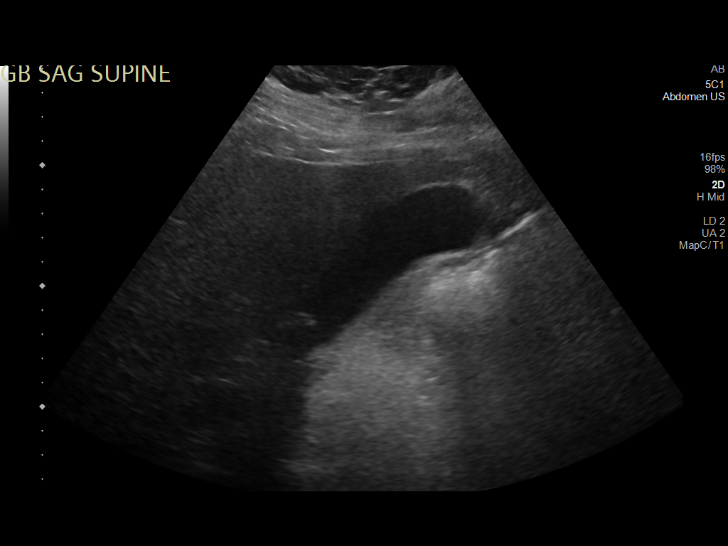
[im 4/46]
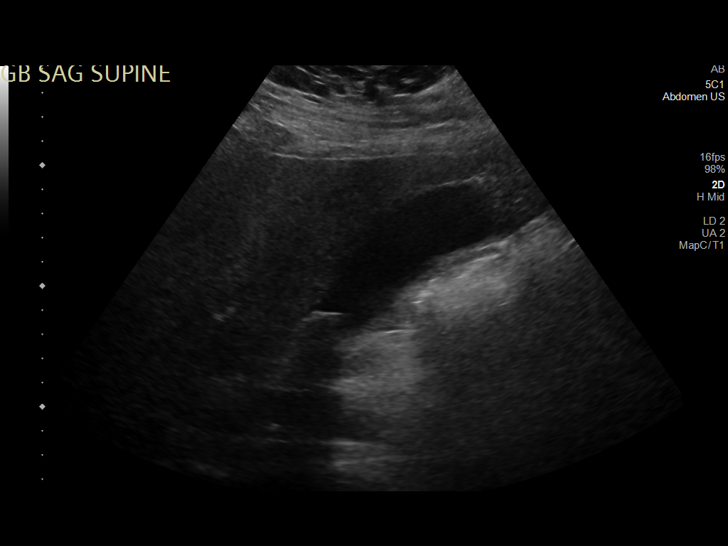
[im 8/46]
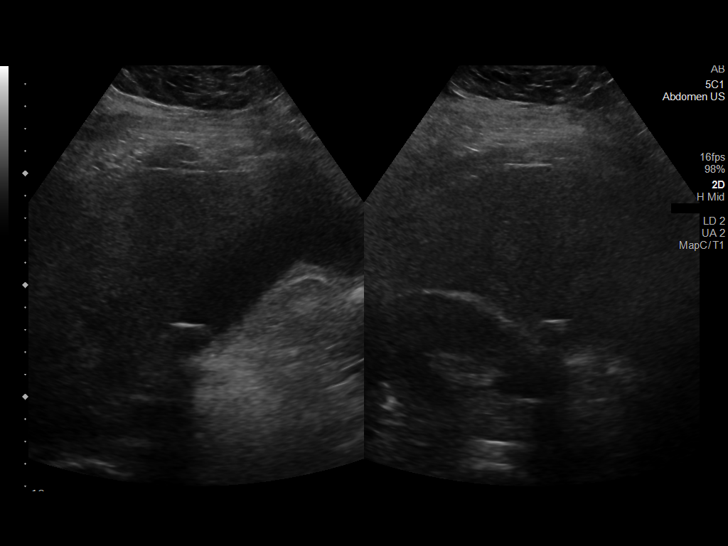
[im 12/46]
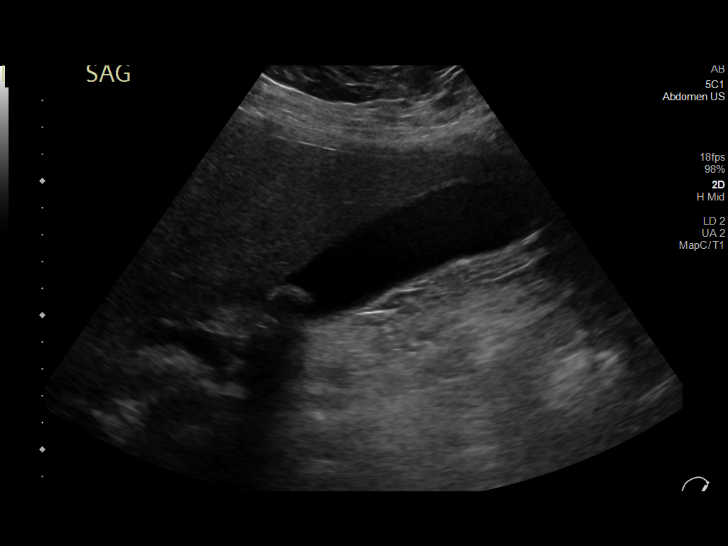
[im 16/46]
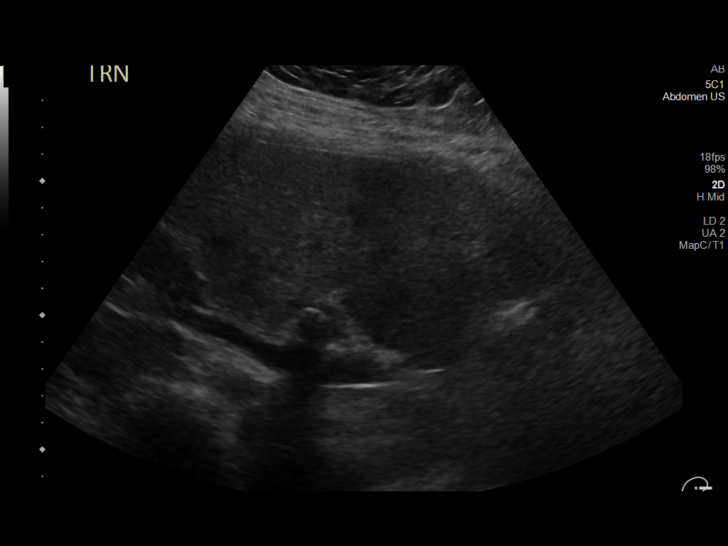
[im 17/46]
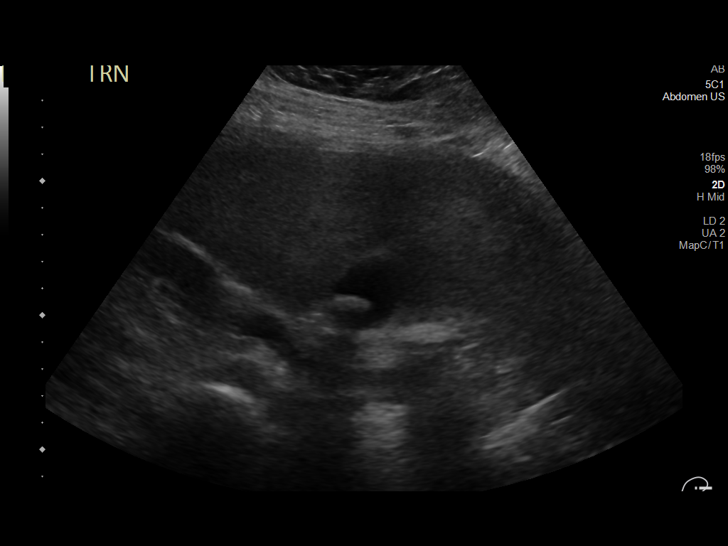
[im 21/46]
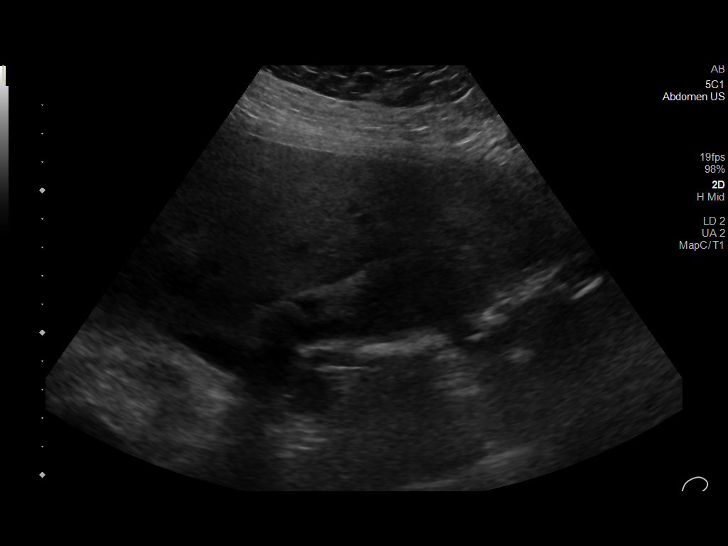
[im 25/46]
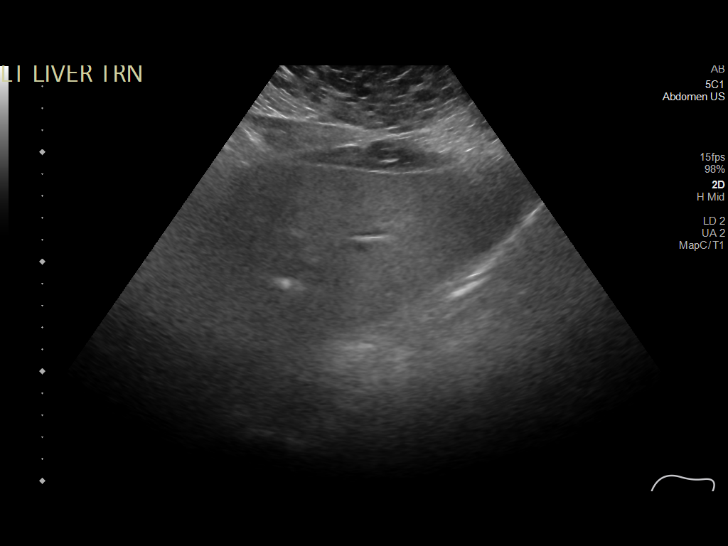
[im 29/46]
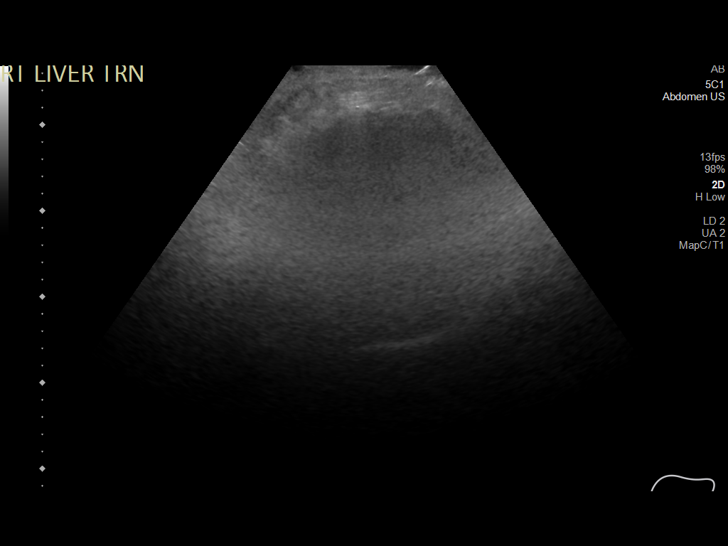
[im 31/46]
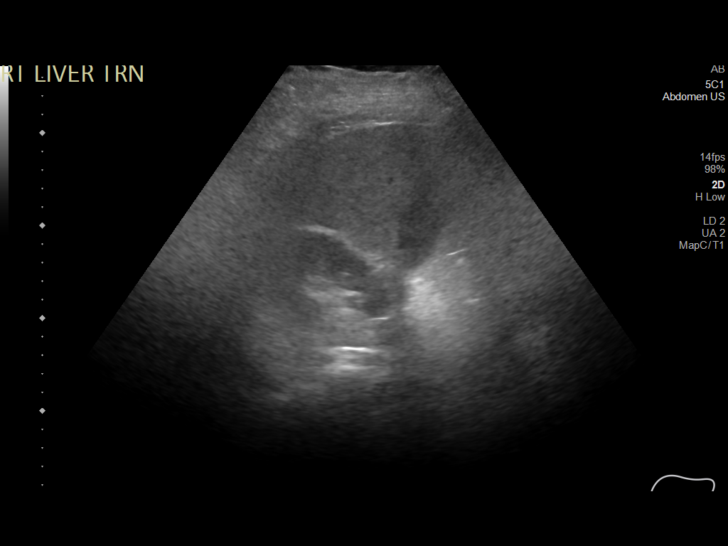
[im 34/46]
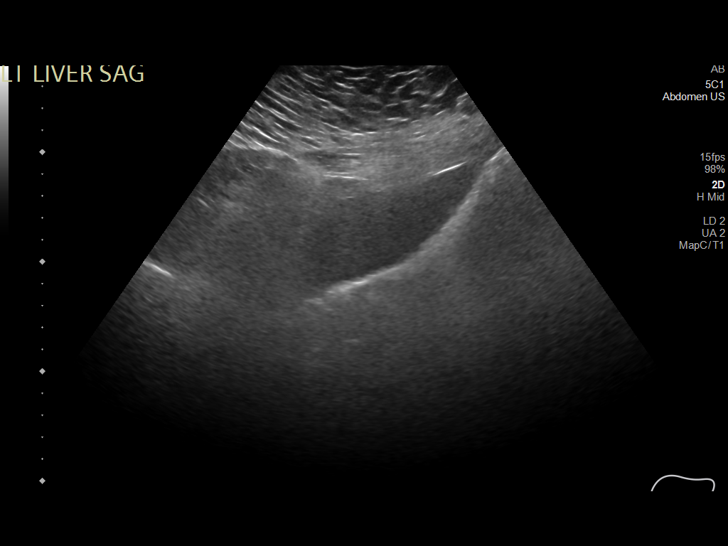
[im 38/46]
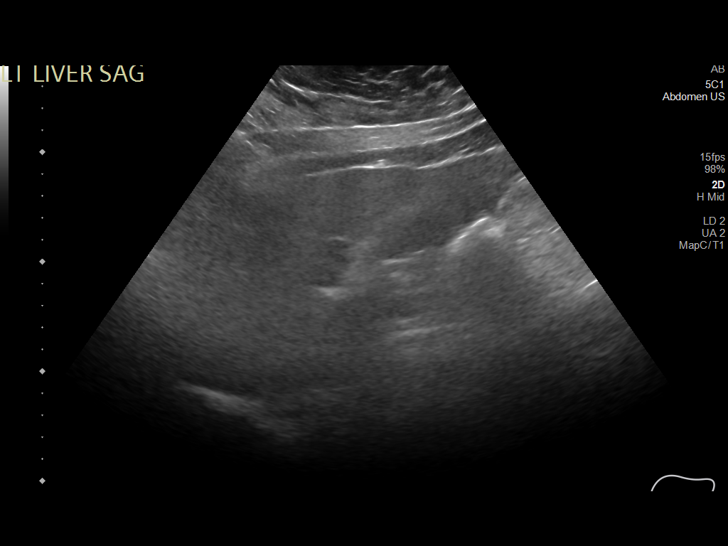
[im 42/46]
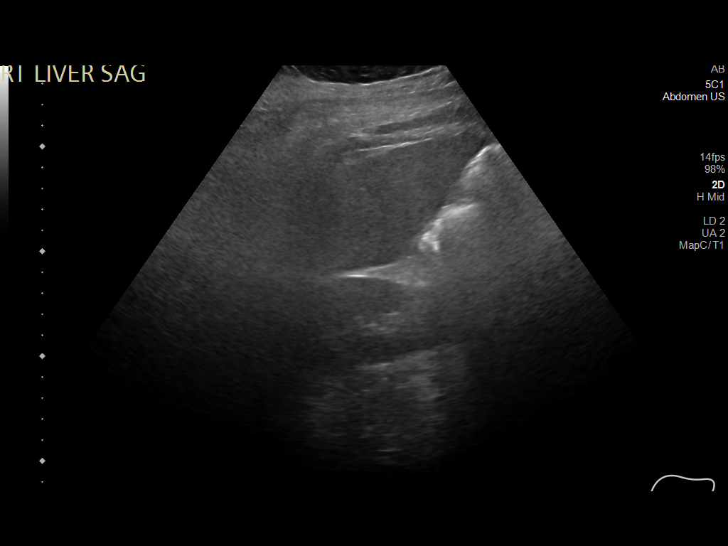
[im 46/46]
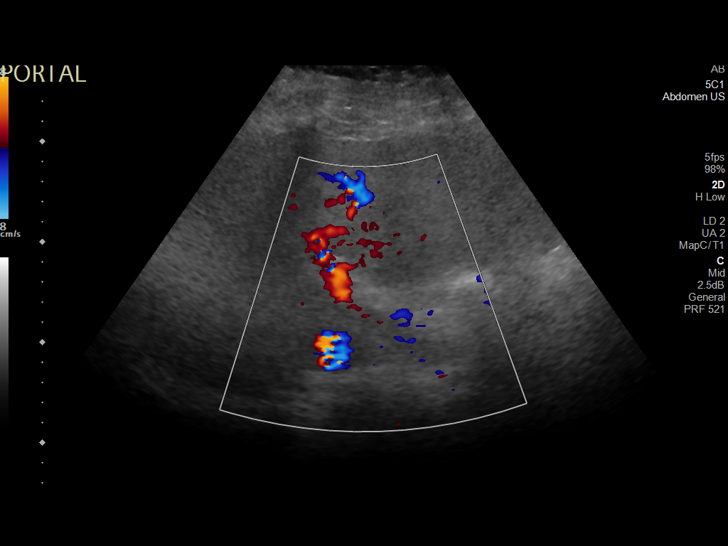

[14 of 25 positions shown; findings below may reference images not displayed]

FINDINGS: Gallbladder:

There is at least 1 shadowing gallstone measuring 1.7 cm. No
gallbladder wall thickening. Positive sonographic Murphy sign.

Common bile duct:

Diameter: 0.5 cm, within normal limits

Liver:

No focal lesion identified. Mildly increased parenchymal
echogenicity. Portal vein is patent on color Doppler imaging with
normal direction of blood flow towards the liver.

Other: None.
IMPRESSION: 1. Cholelithiasis. No gallbladder wall thickening. Positive
sonographic Murphy sign. Findings are technically indeterminate for
acute cholecystitis. Further evaluation with CT or nuclear medicine
HIDA scan may be performed.

2. Mildly increased liver parenchymal echogenicity which is
nonspecific but may represent early fatty infiltration.

## 2023-05-15 IMAGING — CR DG CHEST 2V
2 series · 2 of 2 positions shown · non-contrast
Comparison: None.

CLINICAL DATA: Cough, dizziness

EXAM:
CHEST - 2 VIEW

[chest pa]
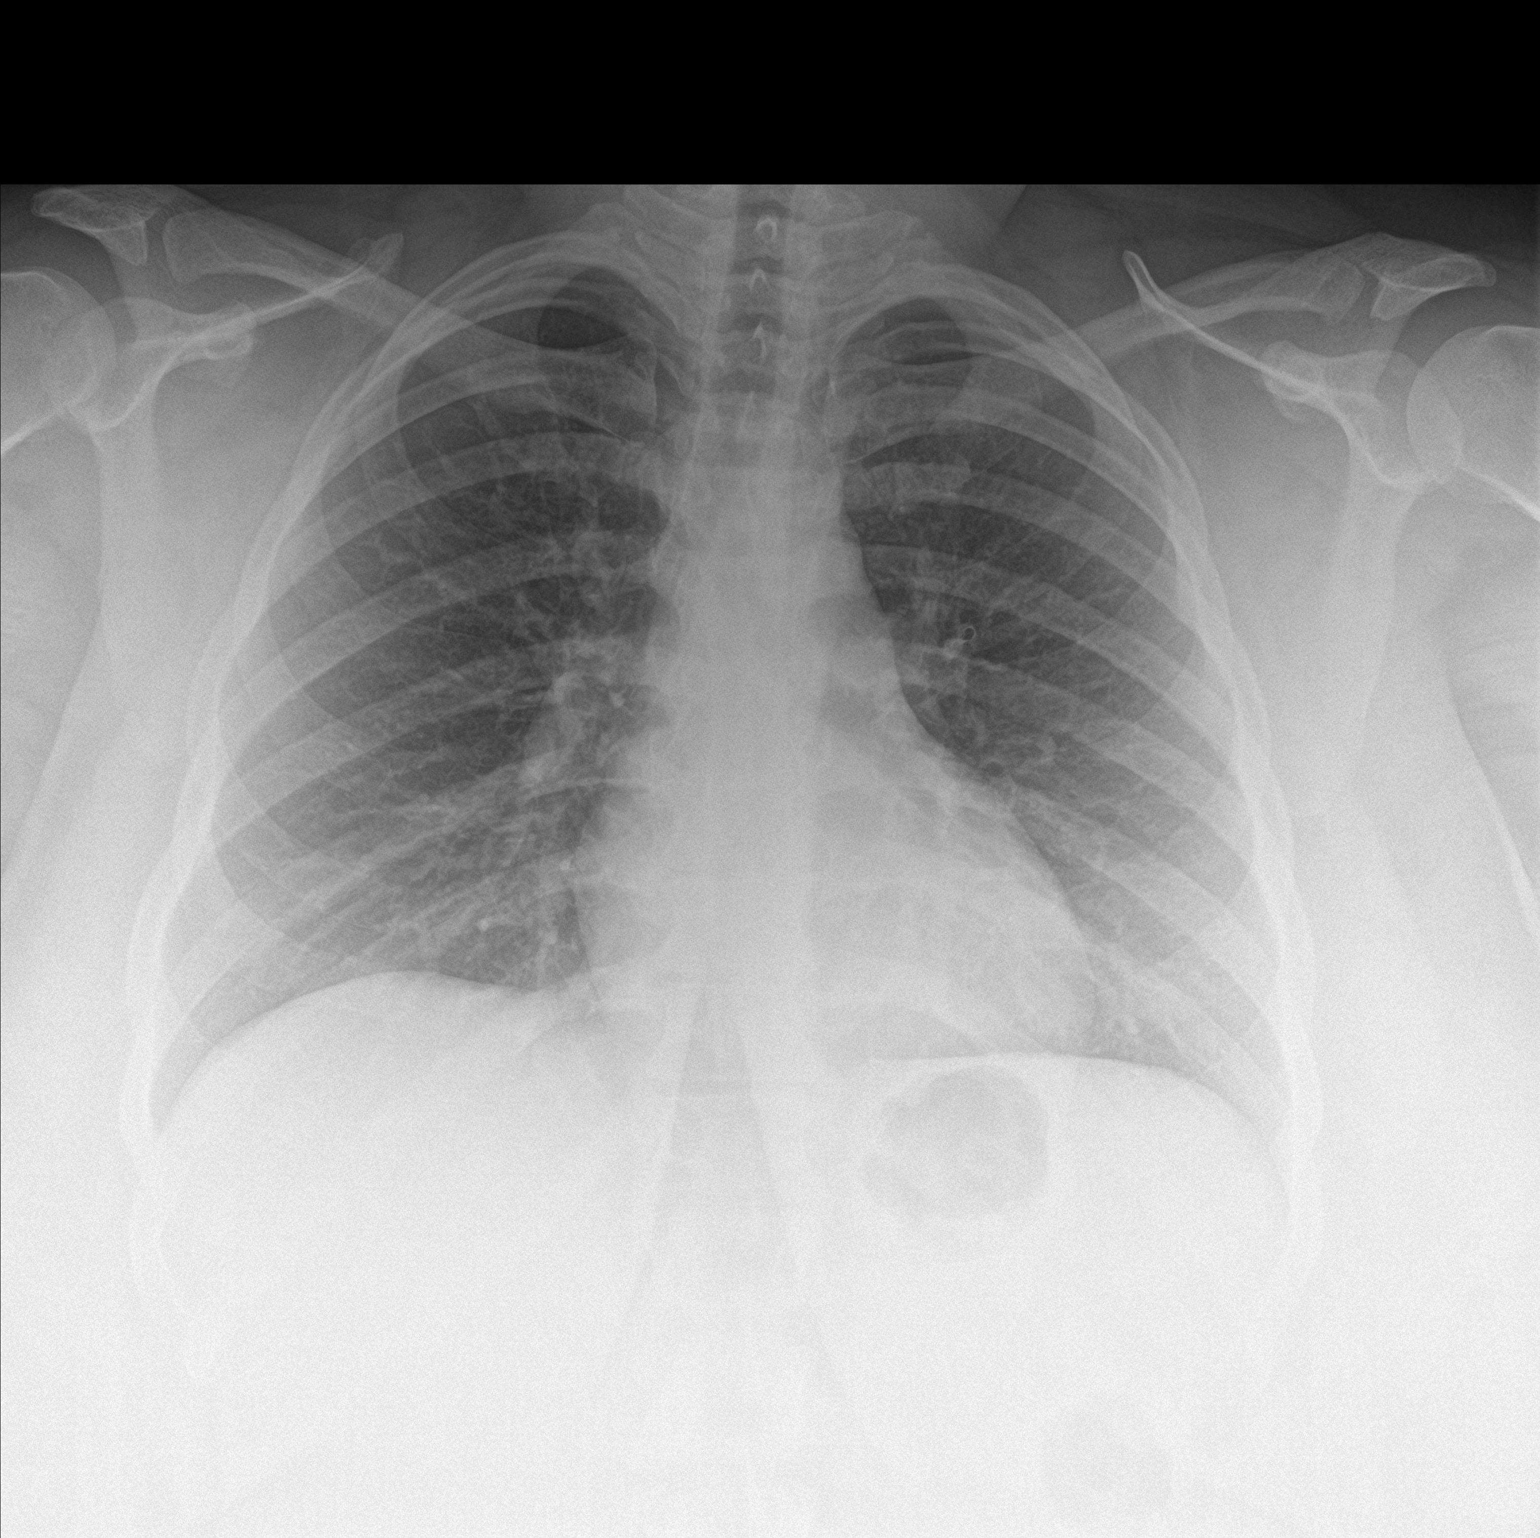

[chest lat]
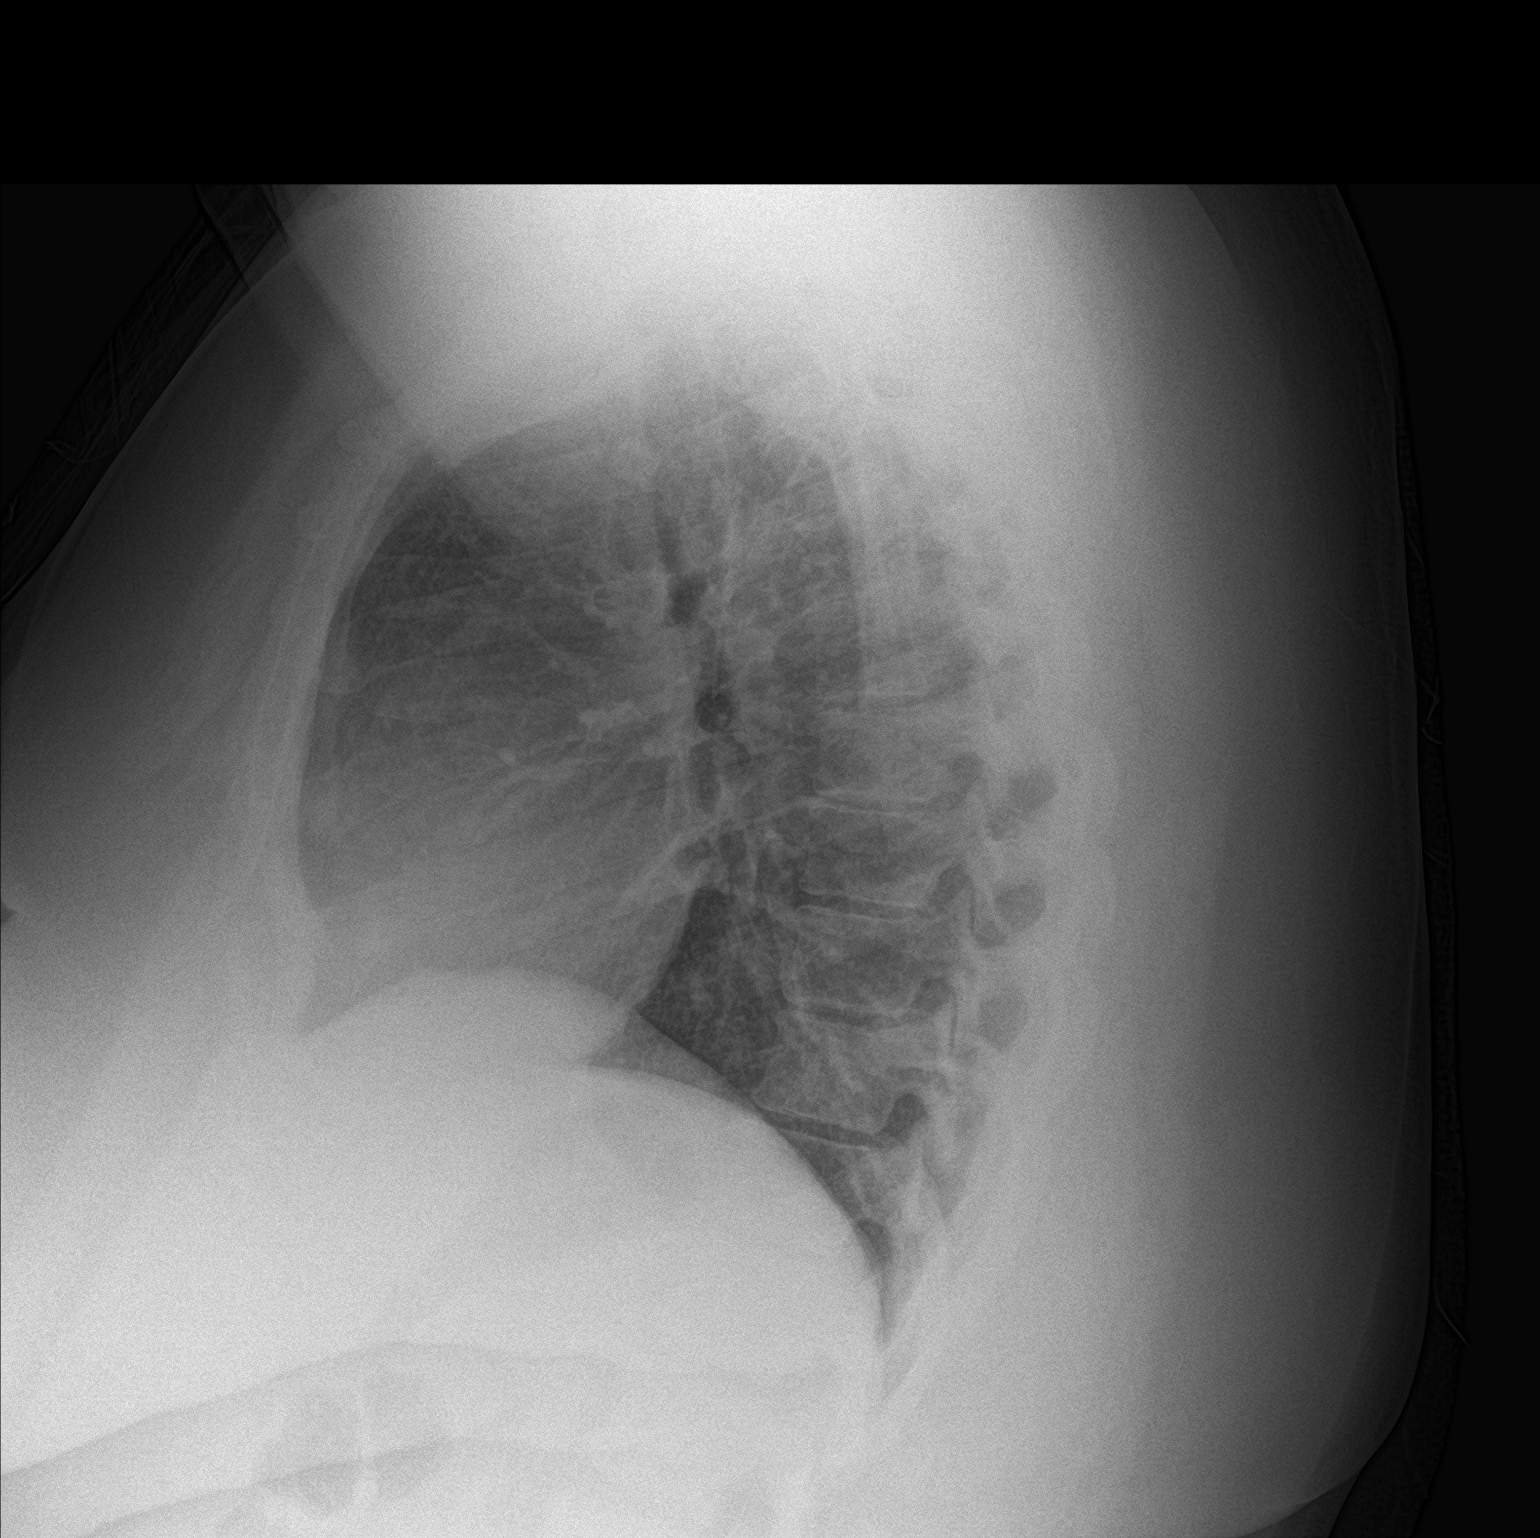

[2 of 2 positions shown; findings below may reference images not displayed]

FINDINGS: The heart size and mediastinal contours are within normal limits.
Both lungs are clear. The visualized skeletal structures are
unremarkable.
IMPRESSION: No active cardiopulmonary disease.

## 2023-07-22 ENCOUNTER — Ambulatory Visit (INDEPENDENT_AMBULATORY_CARE_PROVIDER_SITE_OTHER): Payer: Medicaid Other | Admitting: Family Medicine

## 2023-07-22 ENCOUNTER — Encounter: Payer: Self-pay | Admitting: Family Medicine

## 2023-07-22 VITALS — BP 113/88 | HR 92 | Ht 67.0 in | Wt 331.8 lb

## 2023-07-22 DIAGNOSIS — B9789 Other viral agents as the cause of diseases classified elsewhere: Secondary | ICD-10-CM

## 2023-07-22 DIAGNOSIS — Z131 Encounter for screening for diabetes mellitus: Secondary | ICD-10-CM

## 2023-07-22 DIAGNOSIS — E039 Hypothyroidism, unspecified: Secondary | ICD-10-CM

## 2023-07-22 DIAGNOSIS — F339 Major depressive disorder, recurrent, unspecified: Secondary | ICD-10-CM | POA: Diagnosis not present

## 2023-07-22 DIAGNOSIS — J028 Acute pharyngitis due to other specified organisms: Secondary | ICD-10-CM | POA: Diagnosis not present

## 2023-07-22 DIAGNOSIS — N921 Excessive and frequent menstruation with irregular cycle: Secondary | ICD-10-CM | POA: Diagnosis not present

## 2023-07-22 LAB — POC SOFIA 2 FLU + SARS ANTIGEN FIA
Influenza A, POC: NEGATIVE
Influenza B, POC: NEGATIVE
SARS Coronavirus 2 Ag: NEGATIVE

## 2023-07-22 LAB — POCT GLYCOSYLATED HEMOGLOBIN (HGB A1C): Hemoglobin A1C: 5.7 % — AB (ref 4.0–5.6)

## 2023-07-22 NOTE — Assessment & Plan Note (Signed)
Was previously taking levothyroxine 100 mcg daily, however reports she has not taken this for at least a year.  Unclear why she stopped.  Discussed with patient that it is possible many of her symptoms are related to this. - Check TSH today; plan to restart levothyroxine

## 2023-07-22 NOTE — Assessment & Plan Note (Addendum)
Heavy daily bleeding.  Patient does report history of PCOS.  She has not seen OB/GYN in several years.  Has previously trialed several OCPs (though she is not sure which ones) and states these did not help.  Concern that this may be contributing to her fatigue. - Labs to include CBC, ferritin today - Patient to return in 2 weeks to reevaluate and discuss hormonal contraceptive options that may decrease her bleeding.  Provided with printed materials on these options today for patient to review. -At next visit likely needs pelvic exam with +/- ultrasound for further evaluation

## 2023-07-22 NOTE — Assessment & Plan Note (Signed)
PHQ screening score elevated.  Discussed at length with patient.  No current SI/HI.  Has failed Prozac, Seroquel, trazodone in the past per patient report.  Discussed trying new medication versus psychiatry referral.  Concern that patient may have depression versus bipolar and as such SSRI may be inappropriate. -Refer to psychiatry for further evaluation - Refer to [facility] for additional resources

## 2023-07-22 NOTE — Patient Instructions (Addendum)
Dear Peggy Patel  Today we discussed the following concerns and plans:  Sick symptoms -Your flu and COVID swab was negative.  Continue Tylenol/ibuprofen at home for symptom management.  Heavy bleeding - Lets follow-up in 2 weeks to continue to discuss contraception options which may reduce your bleeding.  I have included information packets for you to read up on if you are interested. - We will check some lab work today for anemia and iron deficiency.  I will let you know if there are any abnormal results.  Thyroid - We are also checking your thyroid levels today.  I will let you know what the results are, and will send in the appropriate medication to your pharmacy.  If you have any concerns, please call the clinic or schedule an appointment.  It was a pleasure to take care of you today. Be well!  Cyndia Skeeters, DO [facility], PGY-1

## 2023-07-22 NOTE — Progress Notes (Signed)
    SUBJECTIVE:   CHIEF COMPLAINT / HPI:   Sick symptoms Began feeling poorly last Sunday night (2/9) with fever up to 102.  Also with cough, congestion, sore throat.  Has had relief with Tylenol.  Drinking soup.  No nausea/vomiting.  Has had to be out of work several days due to illness.  Heavy bleeding This has been a longstanding problem.  Bleeding every day for the last year, soaking at least 2 pads.  Also has cramping.  Has tried "all of the pills" in the past without much relief.  Has never had IUD and is curious about this option.  Has not followed with OB/GYN in a couple of years.  Fatigue This is another longstanding problem.  Worse lately since she has been sick.  She also reports that she has not been taking her levothyroxine for at least a year.  PERTINENT  PMH / PSH:  Hypothyroid Depression Obesity PCOS   OBJECTIVE:   BP 113/88   Pulse 92   Ht 5\' 7"  (1.702 m)   Wt (!) 331 lb 12.8 oz (150.5 kg)   SpO2 98%   BMI 51.97 kg/m   General: Well-appearing, no acute distress. HEENT: normocephalic, PERRLA, MMM. Cardio: Regular rate, regular rhythm, no murmurs on exam. Pulm: Does have transferred upper airway sounds.  Lungs clear, no wheezing, no crackles. No increased work of breathing. Extremities: no peripheral edema. Moves all extremities equally. Neuro: Alert and oriented x3, speech normal in content, no facial asymmetry. Psych:  Cognition and judgment appear intact. Alert, communicative, and cooperative.  ASSESSMENT/PLAN:   Depression PHQ screening score elevated.  Discussed at length with patient.  No current SI/HI.  Has failed Prozac, Seroquel, trazodone in the past per patient report.  Discussed trying new medication versus psychiatry referral.  Concern that patient may have depression versus bipolar and as such SSRI may be inappropriate. -Refer to psychiatry for further evaluation - Refer to [facility] for additional resources  Menorrhagia Heavy daily bleeding.   Patient does report history of PCOS.  She has not seen OB/GYN in several years.  Has previously trialed several OCPs (though she is not sure which ones) and states these did not help.  Concern that this may be contributing to her fatigue. - Labs to include CBC, ferritin today - Patient to return in 2 weeks to reevaluate and discuss hormonal contraceptive options that may decrease her bleeding.  Provided with printed materials on these options today for patient to review. -At next visit likely needs pelvic exam with +/- ultrasound for further evaluation  Hypothyroid Was previously taking levothyroxine 100 mcg daily, however reports she has not taken this for at least a year.  Unclear why she stopped.  Discussed with patient that it is possible many of her symptoms are related to this. - Check TSH today; plan to restart levothyroxine   Will need annual exam rescheduled in future when pt is feeling better.  Cyndia Skeeters, DO [facility]

## 2023-07-23 ENCOUNTER — Telehealth: Payer: Self-pay | Admitting: *Deleted

## 2023-07-23 LAB — CBC
Hematocrit: 40.9 % (ref 34.0–46.6)
Hemoglobin: 13.1 g/dL (ref 11.1–15.9)
MCH: 27.9 pg (ref 26.6–33.0)
MCHC: 32 g/dL (ref 31.5–35.7)
MCV: 87 fL (ref 79–97)
Platelets: 251 10*3/uL (ref 150–450)
RBC: 4.7 x10E6/uL (ref 3.77–5.28)
RDW: 13.7 % (ref 11.7–15.4)
WBC: 5.8 10*3/uL (ref 3.4–10.8)

## 2023-07-23 LAB — FERRITIN: Ferritin: 149 ng/mL (ref 15–150)

## 2023-07-23 LAB — T4F: T4,Free (Direct): 0.83 ng/dL (ref 0.82–1.77)

## 2023-07-23 LAB — TSH RFX ON ABNORMAL TO FREE T4: TSH: 13.6 u[IU]/mL — ABNORMAL HIGH (ref 0.450–4.500)

## 2023-07-23 NOTE — Progress Notes (Signed)
Complex Care Management Note  Care Guide Note 07/23/2023 Name: Anvika Gashi MRN: [id number] DOB: 1990/06/19  Rishita Petron is a 33 y.o. year old female who sees Cyndia Skeeters, DO for primary care. I reached out to [employer] by phone today to offer complex care management services.  Ms. Conry was given information about Complex Care Management services today including:   The Complex Care Management services include support from the care team which includes your Nurse Care Manager, Clinical Social Worker, or Pharmacist.  The Complex Care Management team is here to help remove barriers to the health concerns and goals most important to you. Complex Care Management services are voluntary, and the patient may decline or stop services at any time by request to their care team member.   Complex Care Management Consent Status: Patient agreed to services and verbal consent obtained.   Follow up plan:  Telephone appointment with complex care management team member scheduled for:  2/20  Encounter Outcome:  Patient Scheduled Gwenevere Ghazi  [facility]  [facility] Guide  Direct Dial: [phone]  Fax [phone]

## 2023-07-27 ENCOUNTER — Encounter: Payer: Self-pay | Admitting: Family Medicine

## 2023-07-27 ENCOUNTER — Other Ambulatory Visit: Payer: Self-pay | Admitting: Family Medicine

## 2023-07-27 DIAGNOSIS — E039 Hypothyroidism, unspecified: Secondary | ICD-10-CM

## 2023-07-27 MED ORDER — LEVOTHYROXINE SODIUM 100 MCG PO TABS: 100.0000 ug | ORAL_TABLET | ORAL | 0 refills | Status: DC

## 2023-07-29 ENCOUNTER — Ambulatory Visit: Payer: Self-pay | Admitting: Licensed Clinical Social Worker

## 2023-07-29 NOTE — Patient Outreach (Signed)
Care Coordination   07/29/2023 Name: Peggy Patel MRN: [id number] DOB: [birth year]   Care Coordination Outreach Attempts:  An unsuccessful telephone outreach was attempted today to offer the patient information about available complex care management services.  Called pt twice left voicemail with phone number requesting callback   Follow Up Plan:  Additional outreach attempts will be made to offer the patient complex care management information and services.   Encounter Outcome:  No Answer   Care Coordination Interventions:  No, not indicated     Gwyndolyn Saxon MSW, LCSW Licensed Clinical Social Worker  [facility], Population Health Direct Dial: [phone]  Fax: [phone]

## 2023-08-05 ENCOUNTER — Ambulatory Visit (INDEPENDENT_AMBULATORY_CARE_PROVIDER_SITE_OTHER): Payer: 59 | Admitting: Student

## 2023-08-05 ENCOUNTER — Encounter: Payer: Self-pay | Admitting: Student

## 2023-08-05 VITALS — BP 121/70 | HR 100 | Ht 67.0 in | Wt 336.0 lb

## 2023-08-05 DIAGNOSIS — Z6841 Body Mass Index (BMI) 40.0 and over, adult: Secondary | ICD-10-CM

## 2023-08-05 DIAGNOSIS — E66813 Obesity, class 3: Secondary | ICD-10-CM | POA: Diagnosis not present

## 2023-08-05 DIAGNOSIS — E039 Hypothyroidism, unspecified: Secondary | ICD-10-CM

## 2023-08-05 DIAGNOSIS — N921 Excessive and frequent menstruation with irregular cycle: Secondary | ICD-10-CM | POA: Diagnosis not present

## 2023-08-05 NOTE — Assessment & Plan Note (Signed)
 Now resolved. Likely secondary to untreated hypothyroidism. F/u if heavy or abnormal bleeding returns.

## 2023-08-05 NOTE — Patient Instructions (Signed)
 It was great seeing you today.  As we discussed, - Referral sent to bariatric surgeon today. - Return in 1 month for follow up with your primary doctor for thyroid check   If you have any questions or concerns, please feel free to call the clinic.   Have a wonderful day,  Dr. Darral Dash [facility] [phone]

## 2023-08-05 NOTE — Progress Notes (Signed)
    SUBJECTIVE:   CHIEF COMPLAINT / HPI:   Peggy Patel is a 33 year old female here to follow-up on abnormal uterine bleeding.  Abnormal uterine bleeding She says that her bleeding issues have resolved since last visit. She has resumed taking her Levothyroxine 100 mcg daily. Says that her clots used to be so large they were the size of the palm of her hand.   Reviewed labs showing Ferritin 149. Denies fatigue, dizziness, etc.  Elevated A1c She was also wanting to know what her A1c was. A1c runs in the family. Struggles to eat some days, other days eats a lot. Works in a [employer] and so she eats there a lot.  PERTINENT  PMH / PSH:  Obesity Depression Financial stressors  OBJECTIVE:   BP 121/70   Pulse 100   Ht 5\' 7"  (1.702 m)   Wt (!) 336 lb (152.4 kg)   SpO2 99%   BMI 52.63 kg/m   General: Pleasant, obese, no distress Cardiac: RRR Respiratory: Normal effort on room air Psych: Normal mood and affect   ASSESSMENT/PLAN:   Hypothyroid Next anticipated TSH/T4 in early April 2025. Continue Levothyroxine 100 mcg daily  Obesity Considering bariatric surgery, referral sent to bariatric surgery for consultation. Continue discussion with PCP. In meantime, patient to read food labels for nutrition information, portion control, etc.  Menorrhagia Now resolved. Likely secondary to untreated hypothyroidism. F/u if heavy or abnormal bleeding returns.     Darral Dash, DO [facility]    {    This will disappear when note is signed, click to select method of visit    :1}

## 2023-08-05 NOTE — Assessment & Plan Note (Addendum)
 Considering bariatric surgery, referral sent to bariatric surgery for consultation. Continue discussion with PCP. In meantime, patient to read food labels for nutrition information, portion control, etc.

## 2023-08-05 NOTE — Assessment & Plan Note (Signed)
 Next anticipated TSH/T4 in early April 2025. Continue Levothyroxine 100 mcg daily

## 2023-08-06 ENCOUNTER — Ambulatory Visit: Payer: Self-pay | Admitting: Licensed Clinical Social Worker

## 2023-08-06 NOTE — Patient Outreach (Addendum)
 Care Coordination   Initial Visit Note   08/06/2023 Name: Peggy Patel MRN: [id number] DOB: [birth year]  Peggy Patel is a 33 y.o. year old female who sees Peggy Skeeters, DO for primary care. I spoke with  Peggy Patel by phone today.  What matters to the patients health and wellness today?  Pt requested assistance with mental health providers offering hybrid services.     Goals Addressed             This Visit's Progress    Care Coordination       Activities and task to complete in order to accomplish goals.   Call in ntwk provider of your choice to schedule your counseling appointment. Start / continue relaxed breathing 3 times daily Self Support options  (engage in self care )         SDOH assessments and interventions completed:  Yes  SDOH Interventions Today    Flowsheet Row Most Recent Value  SDOH Interventions   Food Insecurity Interventions Intervention Not Indicated  Housing Interventions Intervention Not Indicated  Transportation Interventions Intervention Not Indicated  Utilities Interventions Intervention Not Indicated        Care Coordination Interventions:  Yes, provided  Interventions Today    Flowsheet Row Most Recent Value  Mental Health Interventions   Mental Health Discussed/Reviewed Mental Health Discussed, Mental Health Reviewed, Coping Strategies, Other  [Processed emotions. Encouraged pt to contact insurance company regarding coverage questions and in ntwk providers.]       Follow up plan: Follow up call scheduled for 08/19/2023    Encounter Outcome:  Patient Visit Completed   Peggy Patel MSW, LCSW Licensed Clinical Social Worker  [facility], Population Health Direct Dial: [phone]  Fax: [phone]

## 2023-08-06 NOTE — Patient Instructions (Addendum)
 Visit Information  Thank you for taking time to visit with me today. Please don't hesitate to contact me if I can be of assistance to you.   Following are the goals we discussed today:   Goals Addressed             This Visit's Progress    Care Coordination       Activities and task to complete in order to accomplish goals.   Call in ntwk provider of your choice to schedule your counseling appointment. Start / continue relaxed breathing 3 times daily Self Support options  (engage in self care )         Our next appointment is by telephone on 08/19/2023 at 3pm  Please call the care guide team at [phone] if you need to cancel or reschedule your appointment.   If you are experiencing a Mental Health or Behavioral Health Crisis or need someone to talk to, please call 911   Patient verbalizes understanding of instructions and care plan provided today and agrees to view in MyChart. Active MyChart status and patient understanding of how to access instructions and care plan via MyChart confirmed with patient.     Telephone follow up appointment with care management team member scheduled for:08/20/2023  Gwyndolyn Saxon MSW, LCSW Licensed Clinical Social Worker  [facility], Population Health Direct Dial: [phone]  Fax: [phone]

## 2023-08-19 ENCOUNTER — Ambulatory Visit: Payer: Self-pay | Admitting: Licensed Clinical Social Worker

## 2023-08-19 NOTE — Patient Outreach (Signed)
 Care Coordination   08/19/2023 Name: Peggy Patel MRN: [id number] DOB: [birth year]   Care Coordination Outreach Attempts:  A second unsuccessful outreach was attempted today to offer the patient with information about available complex care management services.  Follow Up Plan:  Additional outreach attempts will be made to offer the patient complex care management information and services.   Encounter Outcome:  No Answer   Care Coordination Interventions:  No, not indicated  {THN Tip this will not be part of the note when signed-REQUIRED REPORT FIELD DO NOT DELETE (Optional):27901  Gwyndolyn Saxon MSW, LCSW Licensed Clinical Social Worker  [facility], Population Health Direct Dial: [phone]  Fax: [phone]

## 2023-09-08 ENCOUNTER — Encounter: Payer: Self-pay | Admitting: Student

## 2023-09-08 ENCOUNTER — Ambulatory Visit (INDEPENDENT_AMBULATORY_CARE_PROVIDER_SITE_OTHER): Payer: 59 | Admitting: Student

## 2023-09-08 VITALS — BP 127/71 | HR 74 | Ht 67.0 in | Wt 343.0 lb

## 2023-09-08 DIAGNOSIS — Z6841 Body Mass Index (BMI) 40.0 and over, adult: Secondary | ICD-10-CM | POA: Diagnosis not present

## 2023-09-08 DIAGNOSIS — E66813 Obesity, class 3: Secondary | ICD-10-CM | POA: Diagnosis not present

## 2023-09-08 DIAGNOSIS — E039 Hypothyroidism, unspecified: Secondary | ICD-10-CM

## 2023-09-08 MED ORDER — LEVOTHYROXINE SODIUM 100 MCG PO TABS: 100.0000 ug | ORAL_TABLET | ORAL | 0 refills | Status: DC

## 2023-09-08 NOTE — Progress Notes (Signed)
    SUBJECTIVE:   CHIEF COMPLAINT / HPI:   Vallery Bastin is a 33 year-old female here for follow-up of her hypothyroidism.  She received call from bariatric surgery but she missed it because she had been working a double shift. She would like the phone number to call them back to get a consultation.  She has not yet been able to get her thyroid medicine, she said it was not at the pharmacy when she went to pick it up. We were planning to get thyroid levels checked today, but she has not been taking her synthroid.  PERTINENT  PMH / PSH:  Obesity Hypothyroidism  OBJECTIVE:   BP 127/71   Pulse 74   Ht 5\' 7"  (1.702 m)   Wt (!) 343 lb (155.6 kg)   SpO2 99%   BMI 53.72 kg/m   General: NAD, obese, no distress Cardiac: RRR Neuro: A&O Respiratory: normal WOB on RA. No wheezing or crackles on auscultation, good lung sounds throughout Extremities: Moving all 4 extremities equally  ASSESSMENT/PLAN:   Hypothyroid Sent in levothyroxine 100 mcg daily to [facility] on [address]. Plan to re-check in 4-6 weeks once she has started taking her medicine.  Obesity Phone number and address provided for [facility] to call and get consultation scheduled. She feels like she is ready to get her weight under better control.     Darral Dash, DO [facility]

## 2023-09-08 NOTE — Patient Instructions (Addendum)
 It was great seeing you today.  As we discussed, - Call the bariatric surgeon below to schedule consultation - I sent in your thyroid medicine to [facility] on [address] [facility] [address] [place], [place] [address] Office: [phone]   If you have any questions or concerns, please feel free to call the clinic.   Have a wonderful day,  Dr. Darral Dash [facility] Family [facility] [phone]

## 2023-09-08 NOTE — Telephone Encounter (Signed)
 Patient calls nurse line regarding note.   Patient states that she missed work yesterday due to stomach cramps and thinking that her appointment was for yesterday instead of today.   Patient requesting note for excused absence for today and yesterday. Forwarding to [employer].   Veronda Prude, RN

## 2023-09-08 NOTE — Assessment & Plan Note (Signed)
 Sent in levothyroxine 100 mcg daily to [facility] on [address]. Plan to re-check in 4-6 weeks once she has started taking her medicine.

## 2023-09-08 NOTE — Assessment & Plan Note (Signed)
 Phone number and address provided for [facility] to call and get consultation scheduled. She feels like she is ready to get her weight under better control.

## 2023-09-24 ENCOUNTER — Other Ambulatory Visit: Payer: Self-pay

## 2023-10-10 NOTE — Progress Notes (Unsigned)
   SUBJECTIVE:   CHIEF COMPLAINT / HPI: Headaches/IUD  IUD - Patient would like an IUD d/t her constant bleeding w/o breaks in her period - Advised patient to schedule an appt with me or Dr. Bernardino Bridge for IUD placement. Discussed to not have unprotected sexual activity prior to the encounter and to take Ibuprofen prior to for pain.   Headaches - Concussion in 2020 and has continued to have headaches - Occipital region to the front, debilitating and made her want to call out of work - Pounding, bilateral, lasts for minutes - hours, nauseous, and debilitating  - Also expresses some tingling and burning in her feet after a long day of being on her feet for 8 hours/day.  - Has been taking Tylenol  and Advil daily to help with the headaches and pain  PERTINENT  PMH / PSH: Concussion, low back injury/pain w/ sciatica, ankle pain  OBJECTIVE:   BP 122/72   Pulse 75   Ht 5\' 7"  (1.702 m)   Wt (!) 338 lb 6.4 oz (153.5 kg)   SpO2 97%   BMI 53.00 kg/m   General: Awake and Alert in NAD HEENT: NCAT. Sclera anicteric. No rhinorrhea. TTP over cervical spine. Cardiovascular: RRR. No M/R/G Respiratory: CTAB, normal WOB on RA. No wheezing, crackles, rhonchi, or diminished breath sounds. Extremities: TTP over bilateral knees. No BLE edema, no deformities or significant joint findings. Skin: Warm and dry. No abrasions or rashes noted. Neuro: No focal neurological deficits.  ASSESSMENT/PLAN:   Assessment & Plan Headaches due to old head injury Pain likely secondary to concussion/degenerative changes in her neck. 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 - Bilateral complete knee XRs Low back pain with bilateral  sciatica, unspecified back pain laterality, unspecified chronicity Low back pain with bilateral sciatica noted this is also due to an injury she suffered.   - Will trial Robaxin 500 mg nightly for 10 days for chronic pain improvement while avoiding Tylenol /ibuprofen. - Referral to PT made for overall pain and headaches   Clyda Dark, DO [facility]

## 2023-10-11 ENCOUNTER — Ambulatory Visit (INDEPENDENT_AMBULATORY_CARE_PROVIDER_SITE_OTHER): Admitting: Family Medicine

## 2023-10-11 ENCOUNTER — Ambulatory Visit: Admitting: Family Medicine

## 2023-10-11 VITALS — BP 122/72 | HR 75 | Ht 67.0 in | Wt 338.4 lb

## 2023-10-11 DIAGNOSIS — M25561 Pain in right knee: Secondary | ICD-10-CM

## 2023-10-11 DIAGNOSIS — M5441 Lumbago with sciatica, right side: Secondary | ICD-10-CM

## 2023-10-11 DIAGNOSIS — G8929 Other chronic pain: Secondary | ICD-10-CM

## 2023-10-11 DIAGNOSIS — M25562 Pain in left knee: Secondary | ICD-10-CM

## 2023-10-11 DIAGNOSIS — M5442 Lumbago with sciatica, left side: Secondary | ICD-10-CM

## 2023-10-11 DIAGNOSIS — G44309 Post-traumatic headache, unspecified, not intractable: Secondary | ICD-10-CM

## 2023-10-11 MED ORDER — METHOCARBAMOL 500 MG PO TABS: 500.0000 mg | ORAL_TABLET | Freq: Every day | ORAL | 0 refills | Status: AC

## 2023-10-11 NOTE — Patient Instructions (Addendum)
 It was great to see you today! Thank you for choosing [facility] for your primary care. Peggy Patel was seen for headaches, chronic pain, and IUD.  Today we addressed: IUD - no unprotected sexual activity prior to visit and take Ibuprofen before coming to your visit. Schedule appt for IUD with Dr. Randeen Busman or Dr. Bernardino Bridge Headaches - will get a XR of you neck to see if there is some degeneration causing your headaches/pain Pain - PT for low back w/ sciatica and knee pain. Will get xrays of your knees as well. Recommend comfortable shoes, knee brace to keep knee more stable.  Robaxin 500 mg daily at bedtime for 10 days.  I have referred you to PT to further evaluate your concern. If you have not received a phone call about this appointment within 3-4 weeks, please call our office back at [phone]. Rosanna Comment coordinates our referrals and can assist you in this.   You should return to our clinic No follow-ups on file. Please arrive 15 minutes before your appointment to ensure smooth check in process.  We appreciate your efforts in making this happen.  Thank you for allowing me to participate in your care, Clyda Dark, DO 10/11/2023, 9:29 AM PGY-1, [facility] Family [facility]

## 2023-10-28 ENCOUNTER — Emergency Department (HOSPITAL_COMMUNITY)
Admission: EM | Admit: 2023-10-28 | Discharge: 2023-10-28 | Disposition: A | Discharge status: Home / Self Care | Attending: Emergency Medicine | Admitting: Emergency Medicine

## 2023-10-28 ENCOUNTER — Other Ambulatory Visit: Payer: Self-pay

## 2023-10-28 ENCOUNTER — Encounter (HOSPITAL_COMMUNITY): Payer: Self-pay | Admitting: Emergency Medicine

## 2023-10-28 ENCOUNTER — Emergency Department (HOSPITAL_COMMUNITY)

## 2023-10-28 DIAGNOSIS — M79672 Pain in left foot: Secondary | ICD-10-CM | POA: Diagnosis not present

## 2023-10-28 DIAGNOSIS — M79671 Pain in right foot: Secondary | ICD-10-CM | POA: Insufficient documentation

## 2023-10-28 DIAGNOSIS — R799 Abnormal finding of blood chemistry, unspecified: Secondary | ICD-10-CM | POA: Insufficient documentation

## 2023-10-28 LAB — CBG MONITORING, ED: Glucose-Capillary: 96 mg/dL (ref 70–99)

## 2023-10-28 MED ORDER — KETOROLAC TROMETHAMINE 15 MG/ML IJ SOLN
15.0000 mg | Freq: Once | INTRAMUSCULAR | Status: AC
Start: 2023-10-28 — End: 2023-10-28
  Administered 2023-10-28: 15 mg via INTRAMUSCULAR
  Filled 2023-10-28: qty 1

## 2023-10-28 MED ORDER — NAPROXEN 500 MG PO TABS: 500.0000 mg | ORAL_TABLET | Freq: Two times a day (BID) | ORAL | 0 refills | Status: DC

## 2023-10-28 NOTE — Discharge Instructions (Addendum)
 Your x-rays and blood sugar look good, I suspect your symptoms are due to plantar fasciitis. Please take prescribed naproxen  twice daily, wear shoes with good arch support.  Can roll feet on a frozen water bottle to help with pain . Follow up with podiatry.

## 2023-10-28 NOTE — ED Triage Notes (Signed)
 Pt. Stated, Ive had foot pain for the last 2 months. Im on my feet all the time. I've taken everything.

## 2023-10-28 NOTE — ED Provider Triage Note (Signed)
 Emergency Medicine Provider Triage Evaluation Note  Peggy Patel , a 33 y.o. female  was evaluated in triage.  Pt complains of bilateral foot pain. Left worse than right, pain ongoing x 2 months. Works at [employer] on her feet all day. Switched to memory foam insoles with some improvement. Pain is in her arch. Feels "bones breaking" every time she walks. Did roll her left ankle a few months ago.   Review of Systems  Positive:  Negative:   Physical Exam  BP 135/67 (BP Location: Right Arm)   Pulse 80   Temp 98.6 F (37 C) (Oral)   Resp 16   SpO2 98%  Gen:   Awake, no distress   Resp:  Normal effort  MSK:   Moves extremities without difficulty  Other:  TTP bilateral arch. No swelling or deformity.   Medical Decision Making  Medically screening exam initiated at 12:27 PM.  Appropriate orders placed.  Lavera Vert was informed that the remainder of the evaluation will be completed by another provider, this initial triage assessment does not replace that evaluation, and the importance of remaining in the ED until their evaluation is complete.     Sherra Dk, PA-C 10/28/23 1229

## 2023-10-28 NOTE — ED Provider Notes (Signed)
 [place] [facility] Provider Note   CSN: [id number] Arrival date & time: 10/28/23  1132     History  Chief Complaint  Patient presents with   Foot Pain    Peggy Patel is a 33 y.o. female.  Peggy Patel is a 33 y.o. female who presents for evaluation of bilateral foot pain left worse than right going on for 2 months.  Patient works at [employer] and is on her feet all day, switch to memory foam insoles but continues to have pain that is worsening.  She reports the pain is primarily in the arch of the foot several months ago she did have an injury to her left ankle but it had resolved.  She has tried over-the-counter pain relievers without improvement.  She continues to have to work on her feet for several hours a day.  She does report that pain seems to be worse with the first step in the morning.  She has not noticed color change or swelling in the feet.  Nothing else makes pain better or worse.  No other aggravating or alleviating factors.  The history is provided by the patient.  Foot Pain       Home Medications Prior to Admission medications   Medication Sig Start Date End Date Taking? Authorizing Provider  Blood Pressure Monitoring (ADULT BLOOD PRESSURE CUFF LG) KIT Take blood pressure 1-2 times a week 10/17/21   Valli Gaw, MD  levothyroxine  (SYNTHROID ) 100 MCG tablet Take 1 tablet (100 mcg total) by mouth every morning. 30 minutes before food 09/08/23   Dameron, Marisa, DO      Allergies    Penicillins and Tomato    Review of Systems   Review of Systems  Constitutional:  Negative for chills and fever.  Musculoskeletal:  Positive for arthralgias.  Skin:  Negative for color change and wound.  Neurological:  Negative for weakness and numbness.    Physical Exam Updated Vital Signs BP 135/67 (BP Location: Right Arm)   Pulse 80   Temp 98.6 F (37 C) (Oral)   Resp 16   SpO2 98%  Physical Exam Vitals and nursing note  reviewed.  Constitutional:      General: She is not in acute distress.    Appearance: Normal appearance. She is well-developed. She is not ill-appearing or diaphoretic.  HENT:     Head: Normocephalic and atraumatic.  Eyes:     General:        Right eye: No discharge.        Left eye: No discharge.  Pulmonary:     Effort: Pulmonary effort is normal. No respiratory distress.  Musculoskeletal:     Comments: Bilateral lower extremities examined, both are warm, dry and well-perfused without discoloration or wounds, DP and PT pulses 2+, sensation intact throughout both feet and patient with 5/5 strength with dorsi and plantarflexion, tenderness primarily over the arch worse on the left foot than right.  Patient does have some calluses noted on the bottom of the feet.  Neurological:     Mental Status: She is alert and oriented to person, place, and time.     Coordination: Coordination normal.  Psychiatric:        Mood and Affect: Mood normal.        Behavior: Behavior normal.     ED Results / Procedures / Treatments   Labs (all labs ordered are listed, but only abnormal results are displayed) Labs Reviewed  CBG MONITORING, ED  EKG None  Radiology DG Foot Complete Left Result Date: 10/28/2023 CLINICAL DATA:  foot pain EXAM: LEFT FOOT - COMPLETE 3+ VIEW COMPARISON:  February 23, 2020 FINDINGS: No acute fracture or dislocation. There is no evidence of arthropathy or other focal bone abnormality. Soft tissues are unremarkable. No radiopaque foreign body. IMPRESSION: No acute fracture or dislocation. Electronically Signed   By: Rance Burrows M.D.   On: 10/28/2023 14:46   DG Foot Complete Right Result Date: 10/28/2023 CLINICAL DATA:  foot pain EXAM: RIGHT FOOT COMPLETE - 3+ VIEW COMPARISON:  None Available. FINDINGS: No acute fracture or dislocation. There is no evidence of arthropathy or other focal bone abnormality. Mild soft tissue swelling over the dorsum of the foot. No radiopaque  foreign body. IMPRESSION: Mild soft tissue swelling over the dorsum of the foot. Otherwise, no acute fracture or dislocation. Electronically Signed   By: Rance Burrows M.D.   On: 10/28/2023 14:43     Procedures Procedures    Medications Ordered in ED Medications - No data to display  ED Course/ Medical Decision Making/ A&P                                 Medical Decision Making Risk Prescription drug management.   33 year old female presents with 2 months of bilateral foot pain, left worse than right.  No wounds, trauma or injury to the feet, works on her feet for many hours.  Presentation is most consistent with plantar fasciitis.  X-rays of bilateral feet obtained and viewed and interpreted independently without bony abnormality.  I had extensive conversation with patient about plantar fasciitis and treatment and expectations that it will take time for this to improve encouraged her to use NSAIDs regularly even if she feels that they are not immediately improving symptoms also encouraged her to roll feet on a frozen water bottle after her workday and to use shoes with good arch support or use specific plantar fasciitis inserts.  Provided work note for few days to reduce time on feet and provided referral to podiatry.  Discharged home in good condition.        Final Clinical Impression(s) / ED Diagnoses Final diagnoses:  Bilateral foot pain    Rx / DC Orders ED Discharge Orders          Ordered    naproxen  (NAPROSYN ) 500 MG tablet  2 times daily        10/28/23 1505              Everlyn Hockey, [place] 11/04/23 0749    Almond Army, MD 11/11/23 1042

## 2023-11-09 ENCOUNTER — Ambulatory Visit (INDEPENDENT_AMBULATORY_CARE_PROVIDER_SITE_OTHER)

## 2023-11-09 ENCOUNTER — Ambulatory Visit (INDEPENDENT_AMBULATORY_CARE_PROVIDER_SITE_OTHER): Admitting: Podiatry

## 2023-11-09 ENCOUNTER — Encounter: Payer: Self-pay | Admitting: Podiatry

## 2023-11-09 DIAGNOSIS — M722 Plantar fascial fibromatosis: Secondary | ICD-10-CM

## 2023-11-09 MED ORDER — MELOXICAM 15 MG PO TABS: 15.0000 mg | ORAL_TABLET | Freq: Every day | ORAL | 0 refills | Status: DC

## 2023-11-09 MED ORDER — TRIAMCINOLONE ACETONIDE 10 MG/ML IJ SUSP
2.5000 mg | Freq: Once | INTRAMUSCULAR | Status: AC
  Administered 2023-11-09: 2.5 mg via INTRA_ARTICULAR

## 2023-11-09 MED ORDER — DEXAMETHASONE SODIUM PHOSPHATE 120 MG/30ML IJ SOLN
4.0000 mg | Freq: Once | INTRAMUSCULAR | Status: AC
  Administered 2023-11-09: 4 mg via INTRA_ARTICULAR

## 2023-11-09 NOTE — Progress Notes (Signed)
  Subjective:  Patient ID: Peggy Patel, female    DOB: 1990-11-03,   MRN: [id number]  Chief Complaint  Patient presents with   Foot Pain    Patient states that she has been having foot pain in her left foot in the middle arch at the bottom of her left foot for the pass 2 months now, patient has tired to take medication for pain and nothing works.  Patient states that the pain is sharp and throbbing.and she stands on her feet all day due to her job    33 y.o. female presents for concern as above.  . Denies any other pedal complaints. Denies n/v/f/c.   History reviewed. No pertinent past medical history.  Objective:  Physical Exam: Vascular: DP/PT pulses 2/4 bilateral. CFT <3 seconds. Normal hair growth on digits. No edema.  Skin. No lacerations or abrasions bilateral feet.  Musculoskeletal: MMT 5/5 bilateral lower extremities in DF, PF, Inversion and Eversion. Deceased ROM in DF of ankle joint. Tender to the medial calcaneal tubercle left . No pain with achilles, PT or arch. No pain with calcaneal squeeze.  Neurological: Sensation intact to light touch.   Assessment:   1. Plantar fasciitis of left foot      Plan:  Patient was evaluated and treated and all questions answered. Discussed plantar fasciitis with patient.  X-rays reviewed and discussed with patient. No acute fractures or dislocations noted. Mild spurring noted at inferior calcaneus. Increase  intensity noted in plantar fascia band. Edema noted.  Discussed treatment options including, ice, NSAIDS, supportive shoes, bracing, and stretching. Stretching exercises provided to be done on a daily basis.   Prescription for meloxicam provided and sent to pharmacy. Kidney function wnl.  Patient requesting injection today. Procedure note below.   PF brace dispensed.  Follow-up 6 weeks or sooner if any problems arise. In the meantime, encouraged to call the office with any questions, concerns, change in symptoms.   Procedure:   Discussed etiology, pathology, conservative vs. surgical therapies. At this time a plantar fascial injection was recommended.  The patient agreed and a sterile skin prep was applied.  An injection consisting of  1cc dexamethasone 0.5 cc kenalog and 1cc marcaine mixture was infiltrated at the point of maximal tenderness on the left Heel.  Bandaid applied. The patient tolerated this well and was given instructions for aftercare.     Jennefer Moats, DPM

## 2023-11-09 NOTE — Patient Instructions (Signed)

## 2023-11-12 ENCOUNTER — Ambulatory Visit: Admitting: Family Medicine

## 2023-11-12 ENCOUNTER — Encounter: Payer: Self-pay | Admitting: Family Medicine

## 2023-11-12 VITALS — BP 126/66 | HR 78 | Ht 67.0 in | Wt 343.2 lb

## 2023-11-12 DIAGNOSIS — N921 Excessive and frequent menstruation with irregular cycle: Secondary | ICD-10-CM | POA: Diagnosis not present

## 2023-11-12 DIAGNOSIS — E039 Hypothyroidism, unspecified: Secondary | ICD-10-CM | POA: Diagnosis not present

## 2023-11-12 DIAGNOSIS — Z1331 Encounter for screening for depression: Secondary | ICD-10-CM

## 2023-11-12 NOTE — Assessment & Plan Note (Addendum)
 Has long history of heavy bleeding. Would greatly benefit from hormonal IUD to decrease bleeding. Discussed this today, answered all questions, patient is interested in proceeding with placement. - scheduled for IUD placement - pt will pre-treat wiuth 800 mg ibuprofen 1 hour prior, or may receive Toradol  shot in clinic for pain control - urine pregnancy test in clinic prior to IUD placement

## 2023-11-12 NOTE — Patient Instructions (Addendum)
 Dear Peggy Patel  Today we discussed the following concerns and plans:  IUD Placement - pre-treat with 800 mg ibuprofen 1 hour before your appointment, or we can give you a medicine at the clinic when you arrive to help with cramping. - plan to go home and relax after your IUD is placed.  We will do lab work today to recheck your TSH (thyroid).  I will resend a referral to Psychiatry. In the meantime, here are some resources to consider:  Psychiatry Resource List (Adults and Children) Most of these providers will take Medicaid. please consult your insurance for a complete and updated list of available providers. When calling to make an appointment have your insurance information available to confirm you are covered.   [facility]:Psychiatry and Counseling [address] [place], [place] [address] [phone]  [facility]  [address] [place], [place] [phone] Crisis [phone]   [facility]:   [facility]: [address]     [phone]   [facility]: [address] [place]. [place],        [phone] [place]: [phone],    [phone] [place]: [phone] [address],                   [phone] Children: [facility] [address]         [phone]  [facility] (virtual only) [phone]   [facility]  (Psychiatry only; Adults /children 12 and over, will take Medicaid)  [address] Amy Kansky [address], [place], [place] [address]       [phone]   [facility] (Psychiatry & counseling ; adults & children ; will take Medicaid [address]   [place] [address]  Go on-line to complete referral ( [email or web address]://[email or web address].org/en/make-[email or web address]-referral [phone]    (Spanish speaking therapists)  [facility]  Psychiatry & counseling; Adults and children;  Call  Registration prior to scheduling an appointment [phone] [address]    [place], [place] [address]    [phone]  [facility] (Psychiatry & counseling; adults & children; Medicare no Medicaid)  [address]   [place], [place]  [address]      [phone]    [facility] (up to age 5)  Psychiatry & counseling ,will take Medicaid, must do counseling to receive psychiatry services  [address] [place] [address]        [phone]  [facility] (Psychiatry & counseling; adults & children; will take Medicaid) Will need a referral from provider [address],  [place], [place]  [phone]   [facility] --- Walk-In Mon-Friday 8am-3pm ( will take Medicaid, Psychiatry, Adults & children,  [address], [place], [place]   [phone]   [facility], Walk-in M-F 8am-12pm and 1pm -3pm   (Counseling, Psychiatry, will take Medicaid, adults & children)  [address], [place], [place]  [phone]       If you have any concerns, please call the clinic or schedule an appointment.  It was a pleasure to take care of you today. Be well!  Omar Bibber, DO [facility], PGY-1

## 2023-11-12 NOTE — Assessment & Plan Note (Signed)
 TSH previously high when she was not on her synthroid . She has since restarted this at 100 mcg daily. - repeat TSH today, adjust dose as needed.

## 2023-11-12 NOTE — Progress Notes (Signed)
    SUBJECTIVE:   CHIEF COMPLAINT / HPI:   Heavy Menses Has had bleeding nearly daily since December. Often has quarter size clots. Has had to leave work early several times due to bleeding. Bleeding did stop a few days ago. Has never had an IUD but is interested in this as an option to decrease bleeding. Not on any other form of birth control currently. Has not been sexually active recently.  Elevated PHQ-9 History of depression, bipolar, PTSD. Has self-harmed in the past. Not currently being treated - has previously tried prozac, seroquel, trazodone, among others but reports these did not help. Previously referred to Psychiatry but she has not been contacted. Denies SI/HI today.  PERTINENT  PMH / PSH: Reviewed. PCOS Hypothyroid Obesity Depression, BPD, PTSD  OBJECTIVE:   BP 126/66   Pulse 78   Ht 5\' 7"  (1.702 m)   Wt (!) 343 lb 4 oz (155.7 kg)   SpO2 99%   BMI 53.76 kg/m    General: Well-appearing, pleasant, no acute distress. HEENT: normocephalic, PERRLA, EOM grossly intact. Cardio: Regular rate, regular rhythm, no murmurs on exam. Pulm: No increased work of breathing. Extremities: Moves all extremities equally. Neuro: Alert and oriented x3, speech normal in content, no facial asymmetry. Psych:  Cognition and judgment appear intact. Alert, communicative.  ASSESSMENT/PLAN:   Assessment & Plan Menorrhagia with irregular cycle Has long history of heavy bleeding. Would greatly benefit from hormonal IUD to decrease bleeding. Discussed this today, answered all questions, patient is interested in proceeding with placement. - scheduled for IUD placement - pt will pre-treat wiuth 800 mg ibuprofen 1 hour prior, or may receive Toradol  shot in clinic for pain control - urine pregnancy test in clinic prior to IUD placement Hypothyroidism, unspecified type TSH previously high when she was not on her synthroid . She has since restarted this at 100 mcg daily. - repeat TSH today,  adjust dose as needed. Positive screening for depression on 9-item Patient Health Questionnaire (PHQ-9) Score 24 today. Negative Q9. Specifically denies SI/HI today. Was previously referred to Psychiatry given complex behavioral health history and did not tolerate several medications, however that referral now shows "denied." Unclear what happened, but will review with our referrals coordinator and re-refer if needed.    Omar Bibber, DO [facility]

## 2023-11-13 LAB — T4F: T4,Free (Direct): 1.15 ng/dL (ref 0.82–1.77)

## 2023-11-13 LAB — TSH RFX ON ABNORMAL TO FREE T4: TSH: 5.08 u[IU]/mL — ABNORMAL HIGH (ref 0.450–4.500)

## 2023-11-15 ENCOUNTER — Other Ambulatory Visit: Payer: Self-pay | Admitting: Family Medicine

## 2023-11-15 ENCOUNTER — Ambulatory Visit: Payer: Self-pay | Admitting: Family Medicine

## 2023-11-15 ENCOUNTER — Ambulatory Visit: Admitting: Family Medicine

## 2023-11-15 ENCOUNTER — Encounter: Payer: Self-pay | Admitting: Family Medicine

## 2023-11-15 ENCOUNTER — Other Ambulatory Visit (HOSPITAL_COMMUNITY)
Admission: RE | Admit: 2023-11-15 | Discharge: 2023-11-15 | Disposition: A | Discharge status: Home / Self Care | Source: Ambulatory Visit | Attending: Family Medicine | Admitting: Family Medicine

## 2023-11-15 VITALS — BP 128/76 | HR 70 | Wt 340.0 lb

## 2023-11-15 DIAGNOSIS — Z113 Encounter for screening for infections with a predominantly sexual mode of transmission: Secondary | ICD-10-CM | POA: Insufficient documentation

## 2023-11-15 DIAGNOSIS — N921 Excessive and frequent menstruation with irregular cycle: Secondary | ICD-10-CM

## 2023-11-15 DIAGNOSIS — Z30430 Encounter for insertion of intrauterine contraceptive device: Secondary | ICD-10-CM

## 2023-11-15 LAB — POCT URINE PREGNANCY: Preg Test, Ur: NEGATIVE

## 2023-11-15 MED ORDER — LEVONORGESTREL 20 MCG/DAY IU IUD
1.0000 | INTRAUTERINE_SYSTEM | Freq: Once | INTRAUTERINE | Status: AC
  Administered 2023-11-15: 1 via INTRAUTERINE

## 2023-11-15 MED ORDER — LEVOTHYROXINE SODIUM 112 MCG PO TABS
112.0000 ug | ORAL_TABLET | ORAL | 3 refills | Status: AC
Start: 2023-11-15 — End: ?

## 2023-11-15 MED ORDER — KETOROLAC TROMETHAMINE 30 MG/ML IJ SOLN
30.0000 mg | Freq: Once | INTRAMUSCULAR | Status: AC
  Administered 2023-11-15: 30 mg via INTRAMUSCULAR

## 2023-11-15 NOTE — Addendum Note (Signed)
 Addended by: Samariya Rockhold C on: 11/15/2023 05:01 PM   Modules accepted: Orders

## 2023-11-15 NOTE — Patient Instructions (Signed)
 Dear Peggy Patel  Today we discussed the following concerns and plans:  IUD Placement - Today you had a Mirena IUD inserted. This should help your periods not be as heavy, and will last as a form of birth control for 8 years. - continue to take ibuprofen 800 mg for the next 2-3 days as needed for cramping. Heating pads can also help. - if you develop a fever, start feeling ill, have nausea/vomiting, or severe abdominal pain, please return to our clinic or go to the ED.  Hypothyroid - increase Synthroid  to 112 mcg daily. I have called this in to your pharmacy.  If you have any concerns, please call the clinic or schedule an appointment.  It was a pleasure to take care of you today. Be well!  Omar Bibber, DO [facility], PGY-1

## 2023-11-15 NOTE — Progress Notes (Signed)
 Mirena/Skyla IUD Insertion Negative STI cultures were reviewed from (DATE) or collected today, and a negative pregnancy test was obtained. Early pregnancy was excluded. The patient: - Has abstained from sexual intercourse since her last menstrual period.  Benefits, indications/contraindications and potential risks of insertion including infection, malposition, uterine perforation, migration of the device even if placed appropriately, expulsion in 2% of patients, amenorrhea, ectopic pregnancy (does not increase risk of ectopic but if she gets pregnant it is more likely to be ectopic), increased risk of PID in the first 21 days post-insertion, and irregular bleeding were discussed with the patient. Informed consent was obtained.  Device Inserted: Mirena   Timeout procedure was performed to ensure right patient and right site. The speculum was placed. Using sterile technique, if needed a single toothed tenaculum was applied to the posterior lip of the cervix and gentle traction applied to straighten and stabilize it. The depth of the uterus was sounded to be of appropriate depth. The IUD was inserted to the appropriate depth and deposited. The string was cut to an estimated 3 cm length. Bleeding was minimal. There were no complications and patient tolerated the procedure well.  No backup contraception needed.  She was advised that the device will need to be removed within 8 years from today's date. Patient counselled on signs/symptoms for follow-up, how to feel the strings, and instructed to make a return appointment with PCP in 1-2 months. Consider checking CBC at next visit.  Lot number [id number]   Hypothyroidism At recent visit TSH rechecked and shown to be high. Pt has been faithfully taking 100 mcg synthroid  daily.  - increased synthroid  to 112 mcg today and recheck TSH in 6-8 weeks.  Omar Bibber, DO [facility], PGY-1 11/15/23 9:09 AM

## 2023-11-16 LAB — CERVICOVAGINAL ANCILLARY ONLY
Chlamydia: NEGATIVE
Comment: NEGATIVE
Comment: NEGATIVE
Comment: NORMAL
Neisseria Gonorrhea: NEGATIVE
Trichomonas: NEGATIVE

## 2023-11-24 ENCOUNTER — Ambulatory Visit: Attending: Family Medicine

## 2023-11-24 DIAGNOSIS — M25561 Pain in right knee: Secondary | ICD-10-CM | POA: Insufficient documentation

## 2023-11-24 DIAGNOSIS — G44309 Post-traumatic headache, unspecified, not intractable: Secondary | ICD-10-CM | POA: Insufficient documentation

## 2023-11-24 DIAGNOSIS — M5459 Other low back pain: Secondary | ICD-10-CM | POA: Diagnosis not present

## 2023-11-24 DIAGNOSIS — M25562 Pain in left knee: Secondary | ICD-10-CM | POA: Diagnosis not present

## 2023-11-24 DIAGNOSIS — M79662 Pain in left lower leg: Secondary | ICD-10-CM | POA: Insufficient documentation

## 2023-11-24 DIAGNOSIS — G8929 Other chronic pain: Secondary | ICD-10-CM | POA: Insufficient documentation

## 2023-11-24 DIAGNOSIS — M5442 Lumbago with sciatica, left side: Secondary | ICD-10-CM | POA: Diagnosis not present

## 2023-11-24 DIAGNOSIS — G4486 Cervicogenic headache: Secondary | ICD-10-CM | POA: Diagnosis not present

## 2023-11-24 DIAGNOSIS — M5441 Lumbago with sciatica, right side: Secondary | ICD-10-CM | POA: Insufficient documentation

## 2023-11-24 DIAGNOSIS — S0990XS Unspecified injury of head, sequela: Secondary | ICD-10-CM | POA: Diagnosis not present

## 2023-11-24 DIAGNOSIS — M542 Cervicalgia: Secondary | ICD-10-CM | POA: Insufficient documentation

## 2023-11-24 NOTE — Therapy (Addendum)
 OUTPATIENT PHYSICAL THERAPY CERVICAL EVALUATION  PHYSICAL THERAPY DISCHARGE SUMMARY  Visits from Start of Care: 1  Current functional level related to goals / functional outcomes: Unable to assess   Remaining deficits: Unable to assess   Education / Equipment: none   Patient agrees to discharge. Patient goals were not met. Patient is being discharged due to not returning since the last visit.  Patient Name: Peggy Patel MRN: [id number] DOB:04-Sep-1990, 33 y.o., female Today's Date: 11/25/2023  END OF SESSION:  PT End of Session - 11/24/23 1425     Visit Number 1    Number of Visits 17    Date for PT Re-Evaluation 01/20/24    Authorization Type [employer]    PT Start Time 1320    PT Stop Time 1405    PT Time Calculation (min) 45 min    Activity Tolerance Patient tolerated treatment well    Behavior During Therapy [facility] for tasks assessed/performed          No past medical history on file. No past surgical history on file. 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/14/2019   Musculoskeletal back pain 11/14/2019   Menorrhagia 09/24/2019   Anovulatory (dysfunctional uterine) bleeding 09/24/2019   Depression 09/24/2019    PCP: Lauraine Norse, DO  REFERRING PROVIDER: Madelon Donald HERO, DO  REFERRING DIAG: G44.309,S09.90XS (ICD-10-CM) - Headaches due to old head injury M25.561,M25.562,G89.29 (ICD-10-CM) - Chronic pain of both knees M54.42,M54.41 (ICD-10-CM) - Low back pain with bilateral sciatica, unspecified back pain laterality, unspecified chronicity  THERAPY DIAG:  Neck pain  Cervicogenic headache  Other low back pain  Pain in left lower leg  Rationale for Evaluation and Treatment: Rehabilitation  ONSET DATE: 10/11/2023- date of referral  SUBJECTIVE:                                                                                                                                                                                                          SUBJECTIVE STATEMENT: Pt reports of having migraine issues for few years. She had mild concussion at work which has progressed her migraines. Pt is currently reporting daily migraines. Pt is only taking Tylenol  for pain relief. Pt reports migraine can last from few hours to full day. Sleeping makes the pain them go away. Pt reports light and sound sensitivity with migraines as well.2013, she had box slip from her  head and then heard a pop. Then few years ago, she was moving furniture and then back popped again. Pt reports difficulty with standing/walking due to back pain. 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 Pt reports of L lumbar radiculopathy.   PERTINENT HISTORY:  Chronic neck pain, back pain, Patient does not drive  PAIN:  Are you having pain? Yes: NPRS scale: 10/10 Pain location: base of neck, headaches start from back of head to front of head, behind eyes and temporals Pain description: base of head Aggravating factors: None Relieving factors: sleeping,   PAIN:  Are you having pain? Yes: NPRS scale: 10/10 Pain location: back pain Pain description: radiating, throbbing, muscle spasms Aggravating factors: standing, walking Relieving factors: taking meds but doesn't help with it.   PRECAUTIONS: None  RED FLAGS: None     WEIGHT BEARING RESTRICTIONS: No  FALLS:  Has patient fallen in last 6 months? 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  DIAGNOSTIC FINDINGS:  none  PATIENT SURVEYS:  Modified Oswestry 72%  NDI:  NECK DISABILITY INDEX  Date: 11/24/23 Score  Pain intensity 5 =The pain is the worst imaginable at the  moment  2. Personal care (washing, dressing, etc.) 2 = It is painful to look after myself and I am slow and careful  3. Lifting 2 = Pain prevents me lifting heavy weights off the floor, but I can manage if they are  conveniently placed, for example on a table  4. Reading 4 =  I can hardly read at all because of severe pain in my neck  5. Headaches 4 = I have severe headaches, which come frequently   6. Concentration 3 = I have a lot of difficulty in concentrating when I want to  7. Work 2 = I can do most of my usual work, but no more  8. Driving 2 =  I can drive my car as long as I want with moderate pain in my neck  9. Sleeping 5 =  My sleep is completely disturbed (5-7 hrs sleepless)   10. Recreation 3 = I am able to engage in a few of my usual recreation activities because of pain in   my neck  Total 32/50 = 64%   Minimum Detectable Change (90% confidence): 5 points or 10% points  COGNITION: Overall cognitive status: Within functional limits for tasks assessed   POSTURE: rounded shoulders, forward head, decreased lumbar lordosis, and increased thoracic kyphosis    CERVICAL ROM:   Active ROM A/PROM (deg) eval  Flexion 40 pain  Extension 20 pain  Right lateral flexion   Left lateral flexion   Right rotation 70 deg  Left rotation 43 deg   (Blank rows = not tested)  UPPER EXTREMITY ROM:  Active ROM Right eval Left eval  Shoulder flexion 180 150 pain  Shoulder extension    Shoulder abduction    Shoulder adduction    Shoulder extension    Shoulder internal rotation    Shoulder external rotation    Elbow flexion    Elbow extension    Wrist flexion    Wrist extension    Wrist ulnar deviation    Wrist radial deviation    Wrist pronation    Wrist supination     (Blank rows = not tested)  UPPER EXTREMITY MMT:  MMT  Right eval Left eval  Shoulder flexion    Shoulder extension    Shoulder abduction    Shoulder adduction    Shoulder extension    Shoulder internal  rotation    Shoulder external rotation    Middle trapezius    Lower trapezius    Elbow flexion    Elbow extension    Wrist flexion    Wrist extension    Wrist ulnar deviation    Wrist radial deviation    Wrist pronation    Wrist supination    Grip strength     (Blank rows = not tested)  Lumbar AROM:  Flexion: unable to go beyond knees with knees straight, able to touch toes with knees bent  TREATMENT DATE:  Patient education:                                                                                                                                 Pt educated on difference between cervicogenic headaches and migraines and POC in therapy to improve cervicogenic headaches and discussed therapy goals of trying to reduce migraine frequency and intensity by at least 50% We discussed potential alternative options for consuting a neurologist for any migraine specific pain medications and/or Botox injections;  consultation with physician specializing in back to potentially obtain MRI and epidural injections for pain management to further evaluate her back condition--all if conservative options of PT fails to provide significant relief.  Passively stretch scalenes on L side and performed light myofascial release to cervical paraspinalis Demo patient on how to perform side bending stretch for c-spine: light pressure only: 3 x 30 L only PATIENT EDUCATION:  Education details: see above Person educated: Patient Education method: Explanation Education comprehension: verbalized understanding  HOME EXERCISE PROGRAM: Access Code: [email or web address] URL: [email or web address] Date: 11/24/2023 Prepared by: Raj Blanch  Exercises - Seated Cervical Sidebending Stretch  - 3 x daily - 7 x weekly - 3 reps - 30 sec hold  ASSESSMENT:  CLINICAL IMPRESSION: Patient is a 33 y.o. female who was seen today for physical therapy evaluation and treatment for chronic neck pain, back pain and  migrianes. Patient demonstrates limited cervical AROM, limited L shoulder AROM, poor posture, increased neural tension in bil LE, limited lumbar AROM, and pain. Patient will benefit from skilled PT to address her pain impairments, improve cervical ROM, improve lumbar AROM, reduce neural tension, educate on activity modification and home exercise program for self management of her symptoms and reduce her migraine frequency and intensity to improve function.   OBJECTIVE IMPAIRMENTS: decreased activity tolerance, decreased endurance, decreased knowledge of condition, decreased ROM, decreased strength, hypomobility, increased fascial restrictions, increased muscle spasms, impaired flexibility, impaired UE functional use, postural dysfunction, and pain.   ACTIVITY LIMITATIONS: carrying, lifting, bending, standing, squatting, and stairs  PARTICIPATION LIMITATIONS: meal prep, cleaning, laundry, shopping, community activity, occupation, and yard work  PERSONAL FACTORS: Age, Past/current experiences, Time since onset of injury/illness/exacerbation, Transportation,  and 1-2 comorbidities: Chronic pain, migraine, concussion, DJD are also affecting patient's functional outcome.   REHAB POTENTIAL: Good  CLINICAL DECISION MAKING: Stable/uncomplicated  EVALUATION COMPLEXITY: Moderate   GOALS: Goals reviewed with patient? Yes  SHORT TERM GOALS: Target date: 12/23/2023   Pt will demo 50% compliance with HEP to self manage her symptoms Baseline: initiated 11/25/23 Goal status: INITIAL   LONG TERM GOALS: Target date: 01/20/2024   Patient will demo 10% improvement in NDI to improve neck pain, ability to turn her head and decrease cervicogenic headaches.  Baseline: NDI 64% (11/24/23) Goal status: INITIAL  2.  Pt will demo 10% improvement on ODI to improve back pain and function  Baseline: ODI 72% (11/24/23) Goal status: INITIAL  3.  Pt will report overall pain to be <7/10 in her neck, back, legs,  shoulder to improve overall function to be able to perform work duties.  Baseline: 10/10 (11/24/23) Goal status: INITIAL  4.  Pt will report migraine frequency to reduce to <15 days/month and migraine intensity to reduce by at least 50% Baseline: Current migraine frequency 30x/30 days (11/24/23); migraine intensity 10/10 Goal status: INITIAL  5.  Pt will demo 100% compliance with HEP to self manage her symptoms. Baseline: initiated 11/24/23 Goal status: INITIAL   Goal status: INITIAL   PLAN:  PT FREQUENCY: 2x/week  PT DURATION: 8 weeks  PLANNED INTERVENTIONS: 97164- PT Re-evaluation, 97750- Physical Performance Testing, 97110-Therapeutic exercises, 97530- Therapeutic activity, 97112- Neuromuscular re-education, 97535- Self Care, 02859- Manual therapy, [phone]- Gait training, Patient/Family education, Balance training, Stair training, Joint mobilization, Joint manipulation, Spinal manipulation, Spinal mobilization, DME instructions, Cryotherapy, and Moist heat  PLAN FOR NEXT SESSION: Review and update HEP   Raj LOISE Blanch, PT 11/25/2023, 12:54 PM  For all possible CPT codes, reference the Planned Interventions line above.     Check all conditions that are expected to impact treatment: {Conditions expected to impact treatment:Morbid obesity, Musculoskeletal disorders, and Social determinants of health   If treatment provided at initial evaluation, no treatment charged due to lack of authorization.

## 2023-11-29 ENCOUNTER — Ambulatory Visit

## 2023-11-29 ENCOUNTER — Telehealth: Payer: Self-pay

## 2023-11-29 NOTE — Telephone Encounter (Signed)
 Patient Name: Stephannie Broner MRN: [id number] DOB:[birth year], 33 y.o., female Today's Date: 11/29/2023  Patient did not show for her 12:30pm PT appointment today. This is her 1st no show. Patient was called but unable to leave voice mail as mail box was full.   Raj LOISE Blanch, PT 11/29/2023, 12:54 PM

## 2023-11-29 NOTE — Therapy (Incomplete)
 OUTPATIENT PHYSICAL THERAPY CERVICAL EVALUATION   Patient Name: Peggy Patel MRN: [id number] DOB:07/22/1990, 33 y.o., female Today's Date: 11/29/2023  END OF SESSION:    No past medical history on file. No past surgical history on file. 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/14/2019   Musculoskeletal back pain 11/14/2019   Menorrhagia 09/24/2019   Anovulatory (dysfunctional uterine) bleeding 09/24/2019   Depression 09/24/2019    PCP: Lauraine Norse, DO  REFERRING PROVIDER: Madelon Donald HERO, DO  REFERRING DIAG: G44.309,S09.90XS (ICD-10-CM) - Headaches due to old head injury M25.561,M25.562,G89.29 (ICD-10-CM) - Chronic pain of both knees M54.42,M54.41 (ICD-10-CM) - Low back pain with bilateral sciatica, unspecified back pain laterality, unspecified chronicity  THERAPY DIAG:  No diagnosis found.  Rationale for Evaluation and Treatment: Rehabilitation  ONSET DATE: 10/11/2023- date of referral  SUBJECTIVE:                                                                                                                                                                                                         SUBJECTIVE STATEMENT: Pt reports of having migraine issues for few years. She had mild concussion at work which has progressed her migraines. Pt is currently reporting daily migraines. Pt is only taking Tylenol  for pain relief. Pt reports migraine can last from few hours to full day. Sleeping makes the pain them go away. Pt reports light and sound sensitivity with migraines as well.2013, she had box slip from her head and then heard a pop. Then few years ago, she was moving furniture and then back popped again. Pt reports difficulty with standing/walking due to back pain. 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 Pt reports of L lumbar radiculopathy.   PERTINENT HISTORY:  Chronic neck pain, back pain, Patient does not drive  PAIN:  Are you having pain? Yes: NPRS scale: 10/10 Pain location: base of neck, headaches start from back of head to front of head, behind eyes and temporals Pain description: base of head Aggravating factors: None Relieving factors: sleeping,   PAIN:  Are you having pain? Yes: NPRS scale: 10/10 Pain location: back pain Pain description: radiating, throbbing, muscle spasms Aggravating factors: standing, walking Relieving factors: taking meds but doesn't help with it.   PRECAUTIONS: None  RED FLAGS: None     WEIGHT BEARING RESTRICTIONS: No  FALLS:  Has patient  fallen in last 6 months? 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  DIAGNOSTIC FINDINGS:  none  PATIENT SURVEYS:  Modified Oswestry 72%  NDI:  NECK DISABILITY INDEX  Date: 11/24/23 Score  Pain intensity 5 =The pain is the worst imaginable at the moment  2. Personal care (washing, dressing, etc.) 2 = It is painful to look after myself and I am slow and careful  3. Lifting 2 = Pain prevents me lifting heavy weights off the floor, but I can manage if they are  conveniently placed, for example on a table  4. Reading 4 =  I can hardly read at all because of severe pain in my neck  5. Headaches 4 = I have severe headaches, which come frequently   6. Concentration 3 = I have a lot of difficulty in concentrating when I want to  7. Work 2 = I can do most of my usual work, but no more  8. Driving 2 =  I can drive my car as long as I want with moderate pain in my neck  9. Sleeping 5 =  My sleep is completely disturbed (5-7 hrs sleepless)   10. Recreation 3 = I am able to engage in a few of my usual recreation activities  because of pain in   my neck  Total 32/50 = 64%   Minimum Detectable Change (90% confidence): 5 points or 10% points  COGNITION: Overall cognitive status: Within functional limits for tasks assessed   POSTURE: rounded shoulders, forward head, decreased lumbar lordosis, and increased thoracic kyphosis    CERVICAL ROM:   Active ROM A/PROM (deg) eval  Flexion 40 pain  Extension 20 pain  Right lateral flexion   Left lateral flexion   Right rotation 70 deg  Left rotation 43 deg   (Blank rows = not tested)  UPPER EXTREMITY ROM:  Active ROM Right eval Left eval  Shoulder flexion 180 150 pain  Shoulder extension    Shoulder abduction    Shoulder adduction    Shoulder extension    Shoulder internal rotation    Shoulder external rotation    Elbow flexion    Elbow extension    Wrist flexion    Wrist extension    Wrist ulnar deviation    Wrist radial deviation    Wrist pronation    Wrist supination     (Blank rows = not tested)  UPPER EXTREMITY MMT:  MMT Right eval Left eval  Shoulder flexion    Shoulder extension    Shoulder abduction    Shoulder adduction    Shoulder extension    Shoulder internal rotation    Shoulder external rotation    Middle trapezius    Lower trapezius    Elbow flexion    Elbow extension    Wrist flexion    Wrist extension    Wrist ulnar deviation    Wrist radial deviation    Wrist pronation    Wrist supination    Grip strength     (Blank rows = not tested)  Lumbar AROM:  Flexion: unable to go beyond knees with knees straight, able to touch toes with knees bent  TREATMENT DATE:  Patient education:  Pt educated on difference between cervicogenic headaches and migraines and POC in therapy to improve cervicogenic headaches and discussed therapy goals of trying to reduce migraine frequency and intensity  by at least 50% We discussed potential alternative options for consuting a neurologist for any migraine specific pain medications and/or Botox injections;  consultation with physician specializing in back to potentially obtain MRI and epidural injections for pain management to further evaluate her back condition--all if conservative options of PT fails to provide significant relief.  Passively stretch scalenes on L side and performed light myofascial release to cervical paraspinalis Demo patient on how to perform side bending stretch for c-spine: light pressure only: 3 x 30 L only PATIENT EDUCATION:  Education details: see above Person educated: Patient Education method: Explanation Education comprehension: verbalized understanding  HOME EXERCISE PROGRAM: Access Code: [email or web address] URL: [email or web address] Date: 11/24/2023 Prepared by: Raj Blanch  Exercises - Seated Cervical Sidebending Stretch  - 3 x daily - 7 x weekly - 3 reps - 30 sec hold  ASSESSMENT:  CLINICAL IMPRESSION: Patient is a 32 y.o. female who was seen today for physical therapy evaluation and treatment for chronic neck pain, back pain and migrianes. Patient demonstrates limited cervical AROM, limited L shoulder AROM, poor posture, increased neural tension in bil LE, limited lumbar AROM, and pain. Patient will benefit from skilled PT to address her pain impairments, improve cervical ROM, improve lumbar AROM, reduce neural tension, educate on activity modification and home exercise program for self management of her symptoms and reduce her migraine frequency and intensity to improve function.   OBJECTIVE IMPAIRMENTS: decreased activity tolerance, decreased endurance, decreased knowledge of condition, decreased ROM, decreased strength, hypomobility, increased fascial restrictions, increased muscle spasms, impaired flexibility, impaired UE functional use, postural dysfunction, and pain.   ACTIVITY LIMITATIONS:  carrying, lifting, bending, standing, squatting, and stairs  PARTICIPATION LIMITATIONS: meal prep, cleaning, laundry, shopping, community activity, occupation, and yard work  PERSONAL FACTORS: Age, Past/current experiences, Time since onset of injury/illness/exacerbation, Transportation, and 1-2 comorbidities: Chronic pain, migraine, concussion, DJD are also affecting patient's functional outcome.   REHAB POTENTIAL: Good  CLINICAL DECISION MAKING: Stable/uncomplicated  EVALUATION COMPLEXITY: Moderate   GOALS: Goals reviewed with patient? Yes  SHORT TERM GOALS: Target date: 12/23/2023   Pt will demo 50% compliance with HEP to self manage her symptoms Baseline: initiated 11/25/23 Goal status: INITIAL   LONG TERM GOALS: Target date: 01/20/2024   Patient will demo 10% improvement in NDI to improve neck pain, ability to turn her head and decrease cervicogenic headaches.  Baseline: NDI 64% (11/24/23) Goal status: INITIAL  2.  Pt will demo 10% improvement on ODI to improve back pain and function  Baseline: ODI 72% (11/24/23) Goal status: INITIAL  3.  Pt will report overall pain to be <7/10 in her neck, back, legs, shoulder to improve overall function to be able to perform work duties.  Baseline: 10/10 (11/24/23) Goal status: INITIAL  4.  Pt will report migraine frequency to reduce to <15 days/month and migraine intensity to reduce by at least 50% Baseline: Current migraine frequency 30x/30 days (11/24/23); migraine intensity 10/10 Goal status: INITIAL  5.  Pt will demo 100% compliance with HEP to self manage her symptoms. Baseline: initiated 11/24/23 Goal status: INITIAL   Goal status: INITIAL   PLAN:  PT FREQUENCY: 2x/week  PT DURATION: 8 weeks  PLANNED INTERVENTIONS: 97164- PT Re-evaluation, 97750- Physical Performance Testing, 97110-Therapeutic exercises, 97530- Therapeutic activity, [id number]- Neuromuscular re-education, 97535- Self Care, 02859- Manual therapy, [id number]- Gait  training, Patient/Family education, Balance training, Stair training, Joint mobilization, Joint manipulation, Spinal manipulation, Spinal mobilization, DME instructions, Cryotherapy, and Moist heat  PLAN FOR NEXT SESSION: Review and update HEP   Raj LOISE Blanch, PT 11/29/2023, 7:57 AM  For all possible CPT codes, reference the Planned Interventions line above.     Check all conditions that are expected to impact treatment: {Conditions expected to impact treatment:Morbid obesity, Musculoskeletal disorders, and Social determinants of health   If treatment provided at initial evaluation, no treatment charged due to lack of authorization.

## 2023-12-03 ENCOUNTER — Encounter: Payer: Self-pay | Admitting: Family Medicine

## 2023-12-06 ENCOUNTER — Telehealth: Payer: Self-pay

## 2023-12-06 ENCOUNTER — Ambulatory Visit

## 2023-12-06 NOTE — Telephone Encounter (Signed)
 Called patient. She reports that pain is intermittent and does not describe this as severe pain.   Scheduled for visit tomorrow afternoon with Dr. Lafe.   ED precautions discussed.   Chiquita JAYSON English, RN

## 2023-12-06 NOTE — Telephone Encounter (Signed)
 Patient Name: Peggy Patel MRN: [id number] DOB:1990/08/26, 33 y.o., female Today's Date: 12/06/2023  Patient did not show for her PT appointment at 11 am. Today was patient's 2nd no show. Tried calling patient but unable to leave Voice mail as mail box was not set up. We will keep her next appointment on 12/13/23. If patient doesn't show for her next appt, she will be discharged from PT based on our cancellation/No-show policy.   Raj LOISE Blanch, PT 12/06/2023, 11:27 AM

## 2023-12-06 NOTE — Therapy (Incomplete)
 OUTPATIENT PHYSICAL THERAPY CERVICAL TREATMENT NOTE   Patient Name: Peggy Patel MRN: [id number] DOB:1990/11/23, 33 y.o., female Today's Date: 12/06/2023  END OF SESSION:    No past medical history on file. No past surgical history on file. 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/14/2019   Musculoskeletal back pain 11/14/2019   Menorrhagia 09/24/2019   Anovulatory (dysfunctional uterine) bleeding 09/24/2019   Depression 09/24/2019    PCP: Lauraine Norse, DO  REFERRING PROVIDER: Madelon Donald HERO, DO  REFERRING DIAG: G44.309,S09.90XS (ICD-10-CM) - Headaches due to old head injury M25.561,M25.562,G89.29 (ICD-10-CM) - Chronic pain of both knees M54.42,M54.41 (ICD-10-CM) - Low back pain with bilateral sciatica, unspecified back pain laterality, unspecified chronicity  THERAPY DIAG:  No diagnosis found.  Rationale for Evaluation and Treatment: Rehabilitation  ONSET DATE: 10/11/2023- date of referral  SUBJECTIVE:                                                                                                                                                                                                         SUBJECTIVE STATEMENT: ***    EVALUATION: Pt reports of having migraine issues for few years. She had mild concussion at work which has progressed her migraines. Pt is currently reporting daily migraines. Pt is only taking Tylenol  for pain relief. Pt reports migraine can last from few hours to full day. Sleeping makes the pain them go away. Pt reports light and sound sensitivity with migraines as well.2013, she had box slip from her head and then heard a pop. Then few years ago, she was moving furniture and then back popped again. Pt reports difficulty with standing/walking due to  back pain. 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 Pt reports of L lumbar radiculopathy.   PERTINENT HISTORY:  Chronic neck pain, back pain, Patient does not drive  PAIN:  Are you having pain? Yes: NPRS scale: 10/10 Pain location: base of neck, headaches start from back of head to front of head, behind eyes and temporals Pain description: base of head Aggravating factors: None Relieving factors: sleeping,   PAIN:  Are you having pain? Yes: NPRS scale: 10/10 Pain location: back pain Pain description: radiating, throbbing, muscle spasms Aggravating factors: standing, walking Relieving factors: taking meds but doesn't help with it.   PRECAUTIONS: None  RED FLAGS: None     WEIGHT BEARING RESTRICTIONS:  No  FALLS:  Has patient fallen in last 6 months? 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  DIAGNOSTIC FINDINGS:  none  PATIENT SURVEYS:  Modified Oswestry 72%  NDI:  NECK DISABILITY INDEX  Date: 11/24/23 Score  Pain intensity 5 =The pain is the worst imaginable at the moment  2. Personal care (washing, dressing, etc.) 2 = It is painful to look after myself and I am slow and careful  3. Lifting 2 = Pain prevents me lifting heavy weights off the floor, but I can manage if they are  conveniently placed, for example on a table  4. Reading 4 =  I can hardly read at all because of severe pain in my neck  5. Headaches 4 = I have severe headaches, which come frequently   6. Concentration 3 = I have a lot of difficulty in concentrating when I want to  7. Work 2 = I can do most of my usual work, but no more  8. Driving 2 =  I can drive my car as long as I want with moderate pain in my neck  9. Sleeping 5 =  My sleep is completely disturbed (5-7 hrs sleepless)   10. Recreation 3 = I am able to engage in a few of  my usual recreation activities because of pain in   my neck  Total 32/50 = 64%   Minimum Detectable Change (90% confidence): 5 points or 10% points  COGNITION: Overall cognitive status: Within functional limits for tasks assessed   POSTURE: rounded shoulders, forward head, decreased lumbar lordosis, and increased thoracic kyphosis    CERVICAL ROM:   Active ROM A/PROM (deg) eval  Flexion 40 pain  Extension 20 pain  Right lateral flexion   Left lateral flexion   Right rotation 70 deg  Left rotation 43 deg   (Blank rows = not tested)  UPPER EXTREMITY ROM:  Active ROM Right eval Left eval  Shoulder flexion 180 150 pain  Shoulder extension    Shoulder abduction    Shoulder adduction    Shoulder extension    Shoulder internal rotation    Shoulder external rotation    Elbow flexion    Elbow extension    Wrist flexion    Wrist extension    Wrist ulnar deviation    Wrist radial deviation    Wrist pronation    Wrist supination     (Blank rows = not tested)  UPPER EXTREMITY MMT:  MMT Right eval Left eval  Shoulder flexion    Shoulder extension    Shoulder abduction    Shoulder adduction    Shoulder extension    Shoulder internal rotation    Shoulder external rotation    Middle trapezius    Lower trapezius    Elbow flexion    Elbow extension    Wrist flexion    Wrist extension    Wrist ulnar deviation    Wrist radial deviation    Wrist pronation    Wrist supination    Grip strength     (Blank rows = not tested)  Lumbar AROM:  Flexion: unable to go beyond knees with knees straight, able to touch toes with knees bent  TREATMENT DATE:  Patient education:  Pt educated on difference between cervicogenic headaches and migraines and POC in therapy to improve cervicogenic headaches and discussed therapy goals of trying to reduce  migraine frequency and intensity by at least 50% We discussed potential alternative options for consuting a neurologist for any migraine specific pain medications and/or Botox injections;  consultation with physician specializing in back to potentially obtain MRI and epidural injections for pain management to further evaluate her back condition--all if conservative options of PT fails to provide significant relief.  Passively stretch scalenes on L side and performed light myofascial release to cervical paraspinalis Demo patient on how to perform side bending stretch for c-spine: light pressure only: 3 x 30 L only PATIENT EDUCATION:  Education details: see above Person educated: Patient Education method: Explanation Education comprehension: verbalized understanding  HOME EXERCISE PROGRAM: Access Code: [email or web address] URL: [email or web address] Date: 11/24/2023 Prepared by: Raj Blanch  Exercises - Seated Cervical Sidebending Stretch  - 3 x daily - 7 x weekly - 3 reps - 30 sec hold  ASSESSMENT:  CLINICAL IMPRESSION: Patient is a 33 y.o. female who was seen today for physical therapy evaluation and treatment for chronic neck pain, back pain and migrianes. Patient demonstrates limited cervical AROM, limited L shoulder AROM, poor posture, increased neural tension in bil LE, limited lumbar AROM, and pain. Patient will benefit from skilled PT to address her pain impairments, improve cervical ROM, improve lumbar AROM, reduce neural tension, educate on activity modification and home exercise program for self management of her symptoms and reduce her migraine frequency and intensity to improve function.   OBJECTIVE IMPAIRMENTS: decreased activity tolerance, decreased endurance, decreased knowledge of condition, decreased ROM, decreased strength, hypomobility, increased fascial restrictions, increased muscle spasms, impaired flexibility, impaired UE functional use, postural dysfunction, and  pain.   ACTIVITY LIMITATIONS: carrying, lifting, bending, standing, squatting, and stairs  PARTICIPATION LIMITATIONS: meal prep, cleaning, laundry, shopping, community activity, occupation, and yard work  PERSONAL FACTORS: Age, Past/current experiences, Time since onset of injury/illness/exacerbation, Transportation, and 1-2 comorbidities: Chronic pain, migraine, concussion, DJD are also affecting patient's functional outcome.   REHAB POTENTIAL: Good  CLINICAL DECISION MAKING: Stable/uncomplicated  EVALUATION COMPLEXITY: Moderate   GOALS: Goals reviewed with patient? Yes  SHORT TERM GOALS: Target date: 12/23/2023   Pt will demo 50% compliance with HEP to self manage her symptoms Baseline: initiated 11/25/23 Goal status: INITIAL   LONG TERM GOALS: Target date: 01/20/2024   Patient will demo 10% improvement in NDI to improve neck pain, ability to turn her head and decrease cervicogenic headaches.  Baseline: NDI 64% (11/24/23) Goal status: INITIAL  2.  Pt will demo 10% improvement on ODI to improve back pain and function  Baseline: ODI 72% (11/24/23) Goal status: INITIAL  3.  Pt will report overall pain to be <7/10 in her neck, back, legs, shoulder to improve overall function to be able to perform work duties.  Baseline: 10/10 (11/24/23) Goal status: INITIAL  4.  Pt will report migraine frequency to reduce to <15 days/month and migraine intensity to reduce by at least 50% Baseline: Current migraine frequency 30x/30 days (11/24/23); migraine intensity 10/10 Goal status: INITIAL  5.  Pt will demo 100% compliance with HEP to self manage her symptoms. Baseline: initiated 11/24/23 Goal status: INITIAL   Goal status: INITIAL   PLAN:  PT FREQUENCY: 2x/week  PT DURATION: 8 weeks  PLANNED INTERVENTIONS: 97164- PT Re-evaluation, 97750- Physical Performance Testing, 97110-Therapeutic exercises, 97530- Therapeutic activity, [id number]- Neuromuscular re-education, 97535- Self Care,  97140- Manual therapy, [id number]-  Gait training, Patient/Family education, Balance training, Stair training, Joint mobilization, Joint manipulation, Spinal manipulation, Spinal mobilization, DME instructions, Cryotherapy, and Moist heat  PLAN FOR NEXT SESSION: Review and update HEP   Raj LOISE Blanch, PT 12/06/2023, 10:18 AM  For all possible CPT codes, reference the Planned Interventions line above.     Check all conditions that are expected to impact treatment: {Conditions expected to impact treatment:Morbid obesity, Musculoskeletal disorders, and Social determinants of health   If treatment provided at initial evaluation, no treatment charged due to lack of authorization.

## 2023-12-07 ENCOUNTER — Encounter: Payer: Self-pay | Admitting: Family Medicine

## 2023-12-07 ENCOUNTER — Ambulatory Visit: Payer: Self-pay | Admitting: Family Medicine

## 2023-12-07 ENCOUNTER — Ambulatory Visit (HOSPITAL_COMMUNITY)
Admission: RE | Admit: 2023-12-07 | Discharge: 2023-12-07 | Disposition: A | Discharge status: Home / Self Care | Source: Ambulatory Visit | Attending: Family Medicine | Admitting: Family Medicine

## 2023-12-07 ENCOUNTER — Ambulatory Visit: Admitting: Family Medicine

## 2023-12-07 VITALS — BP 118/70 | HR 88 | Ht 67.0 in | Wt 333.0 lb

## 2023-12-07 DIAGNOSIS — T8384XA Pain from genitourinary prosthetic devices, implants and grafts, initial encounter: Secondary | ICD-10-CM | POA: Diagnosis not present

## 2023-12-07 DIAGNOSIS — N921 Excessive and frequent menstruation with irregular cycle: Secondary | ICD-10-CM

## 2023-12-07 DIAGNOSIS — F339 Major depressive disorder, recurrent, unspecified: Secondary | ICD-10-CM | POA: Diagnosis not present

## 2023-12-07 DIAGNOSIS — Z30430 Encounter for insertion of intrauterine contraceptive device: Secondary | ICD-10-CM | POA: Diagnosis not present

## 2023-12-07 LAB — POCT HEMOGLOBIN: Hemoglobin: 11.8 g/dL (ref 11–14.6)

## 2023-12-07 NOTE — Patient Instructions (Signed)
 It was so good to see you today! Thank you for allowing me to take care of you.  Today we discussed the following concerns and plans:  Cramping, bleeding - I could not see your IUD strings today; it is important that you have an ultrasound (and possibly an xray) to check the placement of your IUD. - we also checked your hemoglobin today - I will let you know the results. - if you have fever, chills, severe cramping/pain, or worsening bleeding, please go to the Emergency Department immediately.  If you have any concerns, please call the clinic or schedule an appointment.  It was a pleasure to take care of you today. Be well!  Lauraine Norse, DO [facility], PGY-2  Don't forget to check out the [facility] in [facility] at [address] [phone] Affordable prices on prescriptions and over-the-counter items, as well as services like vaccinations and medication home delivery.

## 2023-12-07 NOTE — Assessment & Plan Note (Addendum)
 Unclear why patient has not been able to get in with psychiatry yet; she would significantly benefit from the services.  I will reach out to our referrals coordinator again. - urgent referral placed to Psychiatry

## 2023-12-07 NOTE — Progress Notes (Signed)
    SUBJECTIVE:   CHIEF COMPLAINT / HPI:   Bleeding, cramping after recent IUD placement IUD placed on 6/9. Bleeding initially stopped for 1 week following IUD placement, but has started again. She is still having daily bleeding - lighter than before IUD placement. Sometimes just spotting, other times has to change pad every 2-3 hours. Also still having cramping, describes it as stabbing. Feels like menstrual cramps but worse. Come and go. Does not interrupt sleep, but does slow her down at work. Has not had any fevers, chills, vaginal discharge or odor. No severe abdominal pain.  MDD Continues to have high score on PHQ-9.  Denies SI/HI.  She has previously been referred to psychiatry but for some reason they have never contacted her.  PERTINENT  PMH / PSH: Reviewed.  OBJECTIVE:   BP 118/70   Pulse 88   Ht 5' 7 (1.702 m)   Wt (!) 333 lb (151 kg)   SpO2 96%   BMI 52.16 kg/m    General: well-appearing, no acute distress. Abdominal: bowel sounds present, soft, non-tender, non-distended.  GU Exam:  External exam: Normal-appearing female external genitalia.   Vaginal exam notable for normal vaginal ruggae with some blood in the vaginal vault. IUD strings cannot be visualized.  Cervix without discharge or obvious lesion.  Bimanual exam reveals normal sized uterus no masses appreciated, no pain with examination.  Chaperoned exam, CMA Jessica.    ASSESSMENT/PLAN:   Assessment & Plan Pain due to intrauterine contraceptive device (IUD), initial encounter (HCC) With cramping, bleeding 3 weeks after IUD placement, and inability to visualize strings on pelvic exam, I am concerned for IUD migration. - transvaginal US  to assess IUD location - KUB ordered in case IUD cannot be visualized on US  Menorrhagia with irregular cycle Fortunately bleeding has improved since placement of IUD, however it does persist. - Will collect POC hemoglobin today to check for anemia Recurrent major  depressive disorder, remission status unspecified (HCC) Unclear why patient has not been able to get in with psychiatry yet; she would significantly benefit from the services.  I will reach out to our referrals coordinator again. - urgent referral placed to Psychiatry    Lauraine Norse, DO [facility]

## 2023-12-07 NOTE — Assessment & Plan Note (Signed)
 Fortunately bleeding has improved since placement of IUD, however it does persist. - Will collect POC hemoglobin today to check for anemia

## 2023-12-08 NOTE — Telephone Encounter (Signed)
 Called, confirmed DOB.  Discussed US  results showing that IUD is in place. Offered pt option of removing IUD (would refer to GYN given that strings were not visible) versus waiting a few weeks to see if cramping/bleeding improves. Discussed with pt that it was take several weeks for these symptoms to subside after IUD placement.  She would like to think about her options and will call clinic and/or message on MyChart with her decision.

## 2023-12-13 ENCOUNTER — Telehealth: Payer: Self-pay

## 2023-12-13 ENCOUNTER — Ambulatory Visit: Attending: Family Medicine

## 2023-12-13 NOTE — Therapy (Incomplete)
 OUTPATIENT PHYSICAL THERAPY CERVICAL TREATMENT NOTE   Patient Name: Peggy Patel MRN: [id number] DOB:[birth year], 33 y.o., female Today's Date: 12/13/2023  END OF SESSION:    No past medical history on file. No past surgical history on file. 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/14/2019   Musculoskeletal back pain 11/14/2019   Menorrhagia 09/24/2019   Anovulatory (dysfunctional uterine) bleeding 09/24/2019   Depression 09/24/2019    PCP: Lauraine Norse, DO  REFERRING PROVIDER: Madelon Donald HERO, DO  REFERRING DIAG: G44.309,S09.90XS (ICD-10-CM) - Headaches due to old head injury M25.561,M25.562,G89.29 (ICD-10-CM) - Chronic pain of both knees M54.42,M54.41 (ICD-10-CM) - Low back pain with bilateral sciatica, unspecified back pain laterality, unspecified chronicity  THERAPY DIAG:  No diagnosis found.  Rationale for Evaluation and Treatment: Rehabilitation  ONSET DATE: 10/11/2023- date of referral  SUBJECTIVE:                                                                                                                                                                                                         SUBJECTIVE STATEMENT: ***    EVALUATION: Pt reports of having migraine issues for few years. She had mild concussion at work which has progressed her migraines. Pt is currently reporting daily migraines. Pt is only taking Tylenol  for pain relief. Pt reports migraine can last from few hours to full day. Sleeping makes the pain them go away. Pt reports light and sound sensitivity with migraines as well.2013, she had box slip from her head and then heard a pop. Then few years ago, she was moving furniture and then back popped again. Pt reports difficulty with standing/walking due to  back pain. 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 Pt reports of L lumbar radiculopathy.   PERTINENT HISTORY:  Chronic neck pain, back pain, Patient does not drive  PAIN:  Are you having pain? Yes: NPRS scale: 10/10 Pain location: base of neck, headaches start from back of head to front of head, behind eyes and temporals Pain description: base of head Aggravating factors: None Relieving factors: sleeping,   PAIN:  Are you having pain? Yes: NPRS scale: 10/10 Pain location: back pain Pain description: radiating, throbbing, muscle spasms Aggravating factors: standing, walking Relieving factors: taking meds but doesn't help with it.   PRECAUTIONS: None  RED FLAGS: None     WEIGHT BEARING RESTRICTIONS:  No  FALLS:  Has patient fallen in last 6 months? 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  DIAGNOSTIC FINDINGS:  none  PATIENT SURVEYS:  Modified Oswestry 72%  NDI:  NECK DISABILITY INDEX  Date: 11/24/23 Score  Pain intensity 5 =The pain is the worst imaginable at the moment  2. Personal care (washing, dressing, etc.) 2 = It is painful to look after myself and I am slow and careful  3. Lifting 2 = Pain prevents me lifting heavy weights off the floor, but I can manage if they are  conveniently placed, for example on a table  4. Reading 4 =  I can hardly read at all because of severe pain in my neck  5. Headaches 4 = I have severe headaches, which come frequently   6. Concentration 3 = I have a lot of difficulty in concentrating when I want to  7. Work 2 = I can do most of my usual work, but no more  8. Driving 2 =  I can drive my car as long as I want with moderate pain in my neck  9. Sleeping 5 =  My sleep is completely disturbed (5-7 hrs sleepless)   10. Recreation 3 = I am able to engage in a few of  my usual recreation activities because of pain in   my neck  Total 32/50 = 64%   Minimum Detectable Change (90% confidence): 5 points or 10% points  COGNITION: Overall cognitive status: Within functional limits for tasks assessed   POSTURE: rounded shoulders, forward head, decreased lumbar lordosis, and increased thoracic kyphosis    CERVICAL ROM:   Active ROM A/PROM (deg) eval  Flexion 40 pain  Extension 20 pain  Right lateral flexion   Left lateral flexion   Right rotation 70 deg  Left rotation 43 deg   (Blank rows = not tested)  UPPER EXTREMITY ROM:  Active ROM Right eval Left eval  Shoulder flexion 180 150 pain  Shoulder extension    Shoulder abduction    Shoulder adduction    Shoulder extension    Shoulder internal rotation    Shoulder external rotation    Elbow flexion    Elbow extension    Wrist flexion    Wrist extension    Wrist ulnar deviation    Wrist radial deviation    Wrist pronation    Wrist supination     (Blank rows = not tested)  UPPER EXTREMITY MMT:  MMT Right eval Left eval  Shoulder flexion    Shoulder extension    Shoulder abduction    Shoulder adduction    Shoulder extension    Shoulder internal rotation    Shoulder external rotation    Middle trapezius    Lower trapezius    Elbow flexion    Elbow extension    Wrist flexion    Wrist extension    Wrist ulnar deviation    Wrist radial deviation    Wrist pronation    Wrist supination    Grip strength     (Blank rows = not tested)  Lumbar AROM:  Flexion: unable to go beyond knees with knees straight, able to touch toes with knees bent  TREATMENT DATE:  Patient education:  Pt educated on difference between cervicogenic headaches and migraines and POC in therapy to improve cervicogenic headaches and discussed therapy goals of trying to reduce  migraine frequency and intensity by at least 50% We discussed potential alternative options for consuting a neurologist for any migraine specific pain medications and/or Botox injections;  consultation with physician specializing in back to potentially obtain MRI and epidural injections for pain management to further evaluate her back condition--all if conservative options of PT fails to provide significant relief.  Passively stretch scalenes on L side and performed light myofascial release to cervical paraspinalis Demo patient on how to perform side bending stretch for c-spine: light pressure only: 3 x 30 L only PATIENT EDUCATION:  Education details: see above Person educated: Patient Education method: Explanation Education comprehension: verbalized understanding  HOME EXERCISE PROGRAM: Access Code: [email or web address] URL: [email or web address] Date: 11/24/2023 Prepared by: Raj Blanch  Exercises - Seated Cervical Sidebending Stretch  - 3 x daily - 7 x weekly - 3 reps - 30 sec hold  ASSESSMENT:  CLINICAL IMPRESSION: Patient is a 33 y.o. female who was seen today for physical therapy evaluation and treatment for chronic neck pain, back pain and migrianes. Patient demonstrates limited cervical AROM, limited L shoulder AROM, poor posture, increased neural tension in bil LE, limited lumbar AROM, and pain. Patient will benefit from skilled PT to address her pain impairments, improve cervical ROM, improve lumbar AROM, reduce neural tension, educate on activity modification and home exercise program for self management of her symptoms and reduce her migraine frequency and intensity to improve function.   OBJECTIVE IMPAIRMENTS: decreased activity tolerance, decreased endurance, decreased knowledge of condition, decreased ROM, decreased strength, hypomobility, increased fascial restrictions, increased muscle spasms, impaired flexibility, impaired UE functional use, postural dysfunction, and  pain.   ACTIVITY LIMITATIONS: carrying, lifting, bending, standing, squatting, and stairs  PARTICIPATION LIMITATIONS: meal prep, cleaning, laundry, shopping, community activity, occupation, and yard work  PERSONAL FACTORS: Age, Past/current experiences, Time since onset of injury/illness/exacerbation, Transportation, and 1-2 comorbidities: Chronic pain, migraine, concussion, DJD are also affecting patient's functional outcome.   REHAB POTENTIAL: Good  CLINICAL DECISION MAKING: Stable/uncomplicated  EVALUATION COMPLEXITY: Moderate   GOALS: Goals reviewed with patient? Yes  SHORT TERM GOALS: Target date: 12/23/2023   Pt will demo 50% compliance with HEP to self manage her symptoms Baseline: initiated 11/25/23 Goal status: INITIAL   LONG TERM GOALS: Target date: 01/20/2024   Patient will demo 10% improvement in NDI to improve neck pain, ability to turn her head and decrease cervicogenic headaches.  Baseline: NDI 64% (11/24/23) Goal status: INITIAL  2.  Pt will demo 10% improvement on ODI to improve back pain and function  Baseline: ODI 72% (11/24/23) Goal status: INITIAL  3.  Pt will report overall pain to be <7/10 in her neck, back, legs, shoulder to improve overall function to be able to perform work duties.  Baseline: 10/10 (11/24/23) Goal status: INITIAL  4.  Pt will report migraine frequency to reduce to <15 days/month and migraine intensity to reduce by at least 50% Baseline: Current migraine frequency 30x/30 days (11/24/23); migraine intensity 10/10 Goal status: INITIAL  5.  Pt will demo 100% compliance with HEP to self manage her symptoms. Baseline: initiated 11/24/23 Goal status: INITIAL   Goal status: INITIAL   PLAN:  PT FREQUENCY: 2x/week  PT DURATION: 8 weeks  PLANNED INTERVENTIONS: 97164- PT Re-evaluation, 97750- Physical Performance Testing, 97110-Therapeutic exercises, 97530- Therapeutic activity, [id number]- Neuromuscular re-education, 97535- Self Care,  97140- Manual therapy, [id number]-  Gait training, Patient/Family education, Balance training, Stair training, Joint mobilization, Joint manipulation, Spinal manipulation, Spinal mobilization, DME instructions, Cryotherapy, and Moist heat  PLAN FOR NEXT SESSION: Review and update HEP   Raj LOISE Blanch, PT 12/13/2023, 7:50 AM  For all possible CPT codes, reference the Planned Interventions line above.     Check all conditions that are expected to impact treatment: {Conditions expected to impact treatment:Morbid obesity, Musculoskeletal disorders, and Social determinants of health   If treatment provided at initial evaluation, no treatment charged due to lack of authorization.

## 2023-12-13 NOTE — Telephone Encounter (Signed)
 Patient Name: Peggy Patel MRN: [id number] DOB:Sep 21, 1990, 33 y.o., female Today's Date: 12/13/2023  Patient did not show for her appointment on 12/13/23 at 11 am. Today was her 3 rd consecutive no show for her physical therapy appointments. Due to our Cancellation/no-show policy, patient is being discharged from physical therapy due to poor compliance. Patient will require new physician order before continuing skilled PT.   Raj LOISE Blanch, PT 12/13/2023, 11:34 AM

## 2023-12-20 ENCOUNTER — Ambulatory Visit

## 2023-12-21 ENCOUNTER — Ambulatory Visit (INDEPENDENT_AMBULATORY_CARE_PROVIDER_SITE_OTHER): Admitting: Podiatry

## 2023-12-21 ENCOUNTER — Encounter: Payer: Self-pay | Admitting: Podiatry

## 2023-12-21 DIAGNOSIS — M76822 Posterior tibial tendinitis, left leg: Secondary | ICD-10-CM

## 2023-12-21 DIAGNOSIS — M722 Plantar fascial fibromatosis: Secondary | ICD-10-CM | POA: Diagnosis not present

## 2023-12-21 MED ORDER — DEXAMETHASONE SODIUM PHOSPHATE 120 MG/30ML IJ SOLN
4.0000 mg | Freq: Once | INTRAMUSCULAR | Status: AC
  Administered 2023-12-21: 4 mg via INTRA_ARTICULAR

## 2023-12-21 MED ORDER — TRIAMCINOLONE ACETONIDE 10 MG/ML IJ SUSP
2.5000 mg | Freq: Once | INTRAMUSCULAR | Status: AC
  Administered 2023-12-21: 2.5 mg via INTRA_ARTICULAR

## 2023-12-21 MED ORDER — DICLOFENAC SODIUM 75 MG PO TBEC: 75.0000 mg | DELAYED_RELEASE_TABLET | Freq: Two times a day (BID) | ORAL | 0 refills | Status: DC

## 2023-12-21 NOTE — Progress Notes (Addendum)
  Subjective:  Patient ID: Peggy Patel, female    DOB: 1991-03-05,   MRN: [id number]  Chief Complaint  Patient presents with   Plantar Fasciitis    Not good, I'm still in pain.  The shot made be foot burn more after I got it.  The medication she prescribed didn't do anything.  It feels like my foot is separating when I turn it a certain way.    33 y.o. female presents for  follow-up of left heel pain. Relates the shot made the foot burn and relates pain in the foot is worse and now going more into her ankle area. She relates difficulty with walking and extreme pain. Meloxicam  has not helped and neither has the brace.  . Denies any other pedal complaints. Denies n/v/f/c.   No past medical history on file.  Objective:  Physical Exam: Vascular: DP/PT pulses 2/4 bilateral. CFT <3 seconds. Normal hair growth on digits. No edema.  Skin. No lacerations or abrasions bilateral feet.  Musculoskeletal: MMT 5/5 bilateral lower extremities in DF, PF, Inversion and Eversion. Deceased ROM in DF of ankle joint. Tender to the medial calcaneal tubercle left . No pain with achilles,  arch. No pain with calcaneal squeeze. Pain now in the PT tendon insertion side and proximally along the tendon. Pain with inverison and plantar flexion of the foot. Pes planus noted.  Neurological: Sensation intact to light touch.   Assessment:   1. Plantar fasciitis of left foot   2. Posterior tibial tendon dysfunction (PTTD) of left lower extremity       Plan:  Patient was evaluated and treated and all questions answered. Discussed plantar fasciitis with patient. Discussed new development of PTTD as well and discussed etiology with patient.  X-rays reviewed and discussed with patient. No acute fractures or dislocations noted. Mild spurring noted at inferior calcaneus. Increase  intensity noted in plantar fascia band. Edema noted.  Discussed treatment options including, ice, NSAIDS, supportive shoes, bracing, and  stretching.  Continue stretching.  Amb ref to PT provided.  Prescription for diclofenac  sent to pharmacy  Patient requesting injection today. Procedure note below.   Ankle brace offered today for PTTD but patient refused.   Follow-up 6 weeks or sooner if any problems arise. In the meantime, encouraged to call the office with any questions, concerns, change in symptoms.   Procedure:  Discussed etiology, pathology, conservative vs. surgical therapies. At this time a plantar fascial injection was recommended.  The patient agreed and a sterile skin prep was applied.  An injection consisting of  1cc dexamethasone  0.5 cc kenalog  and 1cc marcaine mixture was infiltrated at the point of maximal tenderness on the left plantar fascia and also into left PT tendon.  Bandaid applied. The patient tolerated this well and was given instructions for aftercare.     Asberry Failing, DPM

## 2023-12-27 ENCOUNTER — Ambulatory Visit

## 2024-01-03 ENCOUNTER — Ambulatory Visit

## 2024-01-10 ENCOUNTER — Ambulatory Visit

## 2024-01-10 VITALS — BP 129/83 | HR 75 | Ht 67.0 in | Wt 346.6 lb

## 2024-01-10 DIAGNOSIS — M25562 Pain in left knee: Secondary | ICD-10-CM

## 2024-01-10 DIAGNOSIS — G8929 Other chronic pain: Secondary | ICD-10-CM

## 2024-01-10 DIAGNOSIS — N921 Excessive and frequent menstruation with irregular cycle: Secondary | ICD-10-CM | POA: Diagnosis not present

## 2024-01-10 DIAGNOSIS — M722 Plantar fascial fibromatosis: Secondary | ICD-10-CM

## 2024-01-10 LAB — POCT HEMOGLOBIN: Hemoglobin: 10 g/dL — AB (ref 11–14.6)

## 2024-01-10 MED ORDER — MELOXICAM 15 MG PO TABS: 15.0000 mg | ORAL_TABLET | Freq: Every day | ORAL | 0 refills | Status: DC

## 2024-01-10 MED ORDER — MEGESTROL ACETATE 40 MG PO TABS: 40.0000 mg | ORAL_TABLET | Freq: Every day | ORAL | 0 refills | Status: DC

## 2024-01-10 NOTE — Progress Notes (Unsigned)
    SUBJECTIVE:   CHIEF COMPLAINT / HPI:   Cramping/bleeding  PERTINENT  PMH / PSH:  Patient had an IUD placed on 11/15/23.  States she had one week without bleeding, then it started back light at first.  However, she states since the middle of June started heavy bleeding again. She passes multiple clots overnight. She states the cramping is constant and radiates to her right hip. She is having to wear multiple pads at a time.  She states she has had experiencing diaper rash.  She has tried Tylenol , Advil without relief. She states she has not had sexual intercourse for 2 to 3 years. States she has a irregular bowel movements twice a week to every other day.  States her left knee is locking up on her when fully extended.  States this has been happening since she dislocated her knee when she was 17.    OBJECTIVE:   BP 129/83   Pulse 75   Ht 5' 7 (1.702 m)   Wt (!) 346 lb 9.6 oz (157.2 kg)   LMP  (LMP Unknown)   SpO2 97%   BMI 54.29 kg/m   General: obese, well-appearing female, NAD Cardiovascular: RRR, no m/r/g Respiratory: CTAB, normal work of breathing on room air Abdomen: soft, non-distended, non-tender to palpation, bowel sounds present MSK: right hip non-tender to palpation, left knee non-tender to palpation, left knee hyperextended   ASSESSMENT/PLAN:   Assessment & Plan Menorrhagia with irregular cycle Continuous bleeding and cramping despite confirmation of correct IUD placement. Megestrol  40 mg for 7 days to help stabilize the uterine lining. Meloxicam  15 mg for cramping, if this does not help, can consider indomethacin.  Chronic pain of left knee Plantar fasciitis of left foot Patient does follow with podiatry, was diagnosed with posterior tibial tendon dysfunction on 7/15. Meloxicam  as above for knee pain. Patient previously tried Meloxicam  for her foot without relief. Encouraged patient to continue stretches she has learned.      Raguel KANDICE Lee, DO [place]  [facility]

## 2024-01-10 NOTE — Patient Instructions (Addendum)
 Today we are going to try Meloxicam  for your cramping.  We are also going to try you on Megestrol  40 mg for 7 days to stabilize and re-boot your uterine lining.

## 2024-01-11 NOTE — Assessment & Plan Note (Signed)
 Continuous bleeding and cramping despite confirmation of correct IUD placement. Megestrol  40 mg for 7 days to help stabilize the uterine lining. Meloxicam  15 mg for cramping, if this does not help, can consider indomethacin.

## 2024-01-17 ENCOUNTER — Ambulatory Visit

## 2024-01-18 ENCOUNTER — Ambulatory Visit

## 2024-01-24 ENCOUNTER — Ambulatory Visit

## 2024-02-02 ENCOUNTER — Ambulatory Visit (HOSPITAL_COMMUNITY)
Admission: EM | Admit: 2024-02-02 | Discharge: 2024-02-02 | Disposition: A | Discharge status: Home / Self Care | Attending: Physician Assistant | Admitting: Physician Assistant

## 2024-02-02 ENCOUNTER — Encounter (HOSPITAL_COMMUNITY): Payer: Self-pay | Admitting: Emergency Medicine

## 2024-02-02 ENCOUNTER — Other Ambulatory Visit: Payer: Self-pay

## 2024-02-02 DIAGNOSIS — R0981 Nasal congestion: Secondary | ICD-10-CM

## 2024-02-02 DIAGNOSIS — Z20822 Contact with and (suspected) exposure to covid-19: Secondary | ICD-10-CM | POA: Diagnosis not present

## 2024-02-02 LAB — POC SARS CORONAVIRUS 2 AG -  ED: SARS Coronavirus 2 Ag: NEGATIVE

## 2024-02-02 MED ORDER — FLUTICASONE PROPIONATE 50 MCG/ACT NA SUSP: 1.0000 | Freq: Every day | NASAL | 0 refills | Status: AC

## 2024-02-02 NOTE — ED Triage Notes (Signed)
 Patient does not have any symptoms.   She was in contact with a family member on Sunday.  This person was not feeling well.  Yesterday, patient was notified that the sick person on Sunday does have covid and strep    Patient denies symptoms

## 2024-02-02 NOTE — Discharge Instructions (Signed)
 You tested negative for COVID.  As we discussed, this does not mean that you are not in the process of developing COVID as there has to be a certain amount of virus before our test comes back positive.  If you have worsening symptoms including increasing congestion, fever, cough, nausea/vomiting, shortness of breath you need to be seen immediately.  It is possible your symptoms are related to seasonal allergies.  I recommend using fluticasone  nasal spray as well as nasal saline and sinus rinses to help manage your symptoms.  If anything worsens or changes please return immediately.

## 2024-02-02 NOTE — ED Provider Notes (Signed)
 [facility]    CSN: [id number] Arrival date & time: 02/02/24  1436      History   Chief Complaint Chief Complaint  Patient presents with   covid test    HPI Peggy Patel is a 33 y.o. female.   Patient presents today with a several day history of mild cough and congestion.  She attributed this to her typical allergy symptoms as she does have significant environmental allergies that occur year-round but then found out that her aunt who had visited her last week has since tested positive for strep pharyngitis and COVID.  Patient has not had COVID since 2021.  She has had initial COVID vaccines but not more recent booster.  She denies additional symptoms including fever, body aches, nausea, vomiting.  She denies history of diabetes or immunosuppression.  She is confident that she is not pregnant as she is actively on her menstrual cycle.  She denies any recent antibiotics or steroids.  She has not been taking any over-the-counter medication as her symptoms are mild and she really thinks they are related to allergies but just wants to make sure she does not have COVID.    History reviewed. No pertinent past medical history.  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 No pertinent surgical history.  OB History   No obstetric history on file.      Home Medications    Prior to Admission medications   Medication Sig Start Date End Date Taking? Authorizing Provider  fluticasone  (FLONASE ) 50 MCG/ACT nasal spray Place 1 spray into both nostrils daily. 02/02/24  Yes Zahari Xiang, Rocky POUR, PA-C  Blood Pressure  Monitoring (ADULT BLOOD PRESSURE CUFF LG) KIT Take blood pressure 1-2 times a week Patient not taking: Reported on 01/10/2024 10/17/21   Hope Merle, MD  diclofenac  (VOLTAREN ) 75 MG EC tablet Take 1 tablet (75 mg total) by mouth 2 (two) times daily. 12/21/23   Sikora, Rebecca, DPM  levothyroxine  (SYNTHROID ) 112 MCG tablet Take 1 tablet (112 mcg total) by mouth every morning. 30 minutes before food 11/15/23   Lafe Domino, DO    Family History History reviewed. No pertinent family history.  Social History Social History   Tobacco Use   Smoking status: Never    Passive exposure: Never   Smokeless tobacco: Never  Vaping Use   Vaping status: Never Used  Substance Use Topics   Alcohol use: Never   Drug use: Never     Allergies   Penicillins and Tomato   Review of Systems Review of Systems  Constitutional:  Negative for activity change, appetite change, fatigue and fever.  HENT:  Positive for congestion. Negative for sinus pressure, sneezing and sore throat.   Respiratory:  Positive for cough. Negative for shortness of breath.   Cardiovascular:  Negative for chest pain.  Gastrointestinal:  Negative for diarrhea, nausea and vomiting.  Musculoskeletal:  Negative for arthralgias and myalgias.  Neurological:  Negative for headaches.     Physical Exam Triage Vital Signs ED Triage Vitals  Encounter Vitals Group     BP 02/02/24 1559 (!) 150/79     Girls Systolic BP Percentile --  Girls Diastolic BP Percentile --      Boys Systolic BP Percentile --      Boys Diastolic BP Percentile --      Pulse Rate 02/02/24 1559 79     Resp 02/02/24 1559 (!) 22     Temp 02/02/24 1559 97.9 F (36.6 C)     Temp Source 02/02/24 1559 Oral     SpO2 02/02/24 1559 98 %     Weight --      Height --      Head Circumference --      Peak Flow --      Pain Score 02/02/24 1556 0     Pain Loc --      Pain Education --      Exclude from Growth Chart --    No data found.  Updated Vital Signs BP  (!) 150/79 (BP Location: Left Arm) Comment (BP Location): regular, forearm  Pulse 79   Temp 97.9 F (36.6 C) (Oral)   Resp (!) 22   LMP  (LMP Unknown) Comment: states she is bleeding since december 2024/has an iud  SpO2 98%   Visual Acuity Right Eye Distance:   Left Eye Distance:   Bilateral Distance:    Right Eye Near:   Left Eye Near:    Bilateral Near:     Physical Exam Vitals reviewed.  Constitutional:      General: She is awake. She is not in acute distress.    Appearance: Normal appearance. She is well-developed. She is not ill-appearing.     Comments: Very pleasant female appears stated age in no acute distress sitting comfortably in exam room  HENT:     Head: Normocephalic and atraumatic.     Right Ear: Tympanic membrane, ear canal and external ear normal. Tympanic membrane is not erythematous or bulging.     Left Ear: Ear canal and external ear normal. Tympanic membrane is retracted. Tympanic membrane is not erythematous or bulging.     Nose:     Right Sinus: No maxillary sinus tenderness or frontal sinus tenderness.     Left Sinus: No maxillary sinus tenderness or frontal sinus tenderness.     Mouth/Throat:     Pharynx: Uvula midline. No oropharyngeal exudate or posterior oropharyngeal erythema.  Cardiovascular:     Rate and Rhythm: Normal rate and regular rhythm.     Heart sounds: Normal heart sounds, S1 normal and S2 normal. No murmur heard. Pulmonary:     Effort: Pulmonary effort is normal.     Breath sounds: Normal breath sounds. No wheezing, rhonchi or rales.     Comments: Clear to auscultation bilaterally Psychiatric:        Behavior: Behavior is cooperative.      UC Treatments / Results  Labs (all labs ordered are listed, but only abnormal results are displayed) Labs Reviewed  POC SARS CORONAVIRUS 2 AG -  ED    EKG   Radiology No results found.  Procedures Procedures (including critical care time)  Medications Ordered in UC Medications -  No data to display  Initial Impression / Assessment and Plan / UC Course  I have reviewed the triage vital signs and the nursing notes.  Pertinent labs & imaging results that were available during my care of the patient were reviewed by me and considered in my medical decision making (see chart for details).     Patient is well-appearing, afebrile, nontoxic, nontachycardic.  She did have some nasal congestion but we  discussed that it is possible this is related to her seasonal allergies though it also could be related to a mild viral illness.  She did request COVID testing which was obtained and negative in clinic.  We discussed that this could be negative because her symptoms are related to seasonal allergies which is what I suspect but it is also possible that she is just beginning to develop symptoms and does not have enough virus present to be detected at this time.  We discussed that if her symptoms progress/worsen she should return for reevaluation.  Recommended conservative treatment measures including nasal saline/sinus rinses as well as fluticasone  nasal spray.  She can use over-the-counter antihistamines for additional symptom relief.  Strict return precautions given.  All questions answered to patient satisfaction.  Final Clinical Impressions(s) / UC Diagnoses   Final diagnoses:  Nasal congestion  Exposure to confirmed case of COVID-19     Discharge Instructions      You tested negative for COVID.  As we discussed, this does not mean that you are not in the process of developing COVID as there has to be a certain amount of virus before our test comes back positive.  If you have worsening symptoms including increasing congestion, fever, cough, nausea/vomiting, shortness of breath you need to be seen immediately.  It is possible your symptoms are related to seasonal allergies.  I recommend using fluticasone  nasal spray as well as nasal saline and sinus rinses to help manage your  symptoms.  If anything worsens or changes please return immediately.     ED Prescriptions     Medication Sig Dispense Auth. Provider   fluticasone  (FLONASE ) 50 MCG/ACT nasal spray Place 1 spray into both nostrils daily. 16 g Moncia Annas K, PA-C      PDMP not reviewed this encounter.   Sherrell Rocky POUR, PA-C 02/02/24 1736

## 2024-02-03 ENCOUNTER — Ambulatory Visit: Admitting: Family Medicine

## 2024-02-04 ENCOUNTER — Ambulatory Visit: Admitting: Family Medicine

## 2024-02-08 ENCOUNTER — Ambulatory Visit: Admitting: Family Medicine

## 2024-02-08 ENCOUNTER — Other Ambulatory Visit: Payer: Self-pay

## 2024-02-08 ENCOUNTER — Ambulatory Visit: Attending: Podiatry

## 2024-02-08 ENCOUNTER — Encounter: Payer: Self-pay | Admitting: Family Medicine

## 2024-02-08 VITALS — BP 128/73 | HR 74 | Ht 67.0 in | Wt 352.8 lb

## 2024-02-08 DIAGNOSIS — E039 Hypothyroidism, unspecified: Secondary | ICD-10-CM

## 2024-02-08 DIAGNOSIS — Z23 Encounter for immunization: Secondary | ICD-10-CM

## 2024-02-08 DIAGNOSIS — M25562 Pain in left knee: Secondary | ICD-10-CM | POA: Diagnosis not present

## 2024-02-08 DIAGNOSIS — Z0000 Encounter for general adult medical examination without abnormal findings: Secondary | ICD-10-CM | POA: Diagnosis not present

## 2024-02-08 DIAGNOSIS — M6281 Muscle weakness (generalized): Secondary | ICD-10-CM | POA: Diagnosis not present

## 2024-02-08 DIAGNOSIS — M76822 Posterior tibial tendinitis, left leg: Secondary | ICD-10-CM | POA: Insufficient documentation

## 2024-02-08 DIAGNOSIS — M722 Plantar fascial fibromatosis: Secondary | ICD-10-CM | POA: Diagnosis not present

## 2024-02-08 DIAGNOSIS — N921 Excessive and frequent menstruation with irregular cycle: Secondary | ICD-10-CM

## 2024-02-08 DIAGNOSIS — R2689 Other abnormalities of gait and mobility: Secondary | ICD-10-CM | POA: Diagnosis not present

## 2024-02-08 DIAGNOSIS — G8929 Other chronic pain: Secondary | ICD-10-CM | POA: Diagnosis not present

## 2024-02-08 DIAGNOSIS — M545 Low back pain, unspecified: Secondary | ICD-10-CM | POA: Diagnosis not present

## 2024-02-08 MED ORDER — IBUPROFEN 400 MG PO TABS: 400.0000 mg | ORAL_TABLET | Freq: Every day | ORAL | 0 refills | Status: DC

## 2024-02-08 MED ORDER — NORGESTIMATE-ETH ESTRADIOL 0.25-35 MG-MCG PO TABS: 1.0000 | ORAL_TABLET | Freq: Every day | ORAL | 0 refills | Status: DC

## 2024-02-08 NOTE — Patient Instructions (Addendum)
 It was so good to see you today! Thank you for allowing me to take care of you.  Today we discussed the following concerns and plans:  An x-ray of your back and left knee was ordered for you---you do not need an appointment to have this completed. I recommend going to [facility] [address] [place]  Heavy Bleeding - start taking Sprintec (birth control pill) every day for 1 month - start taking ibuprofen  once a day for 1 week - I have referred you to gynecology; they should call you to make an appointment.  If you have any concerns, please call the clinic or schedule an appointment.  It was a pleasure to take care of you today. Be well!  Lauraine Norse, DO [facility], PGY-2  Do you need your medications delivered to your home?   We'll send your prescription to the [facility] for delivery.          Address: [address] [place], [place], [place] [address]          Phone: [phone]  Please call the [facility] to speak with a pharmacist and set up your home medication delivery. If you have any questions, feel free to contact us  -- we're happy to help!  Other [facility] that offer affordable prices on both prescriptions and over-the-counter items, as well as convenient services like vaccinations, are  [facility], at [facility]         Address:  [address], [place], [place] [address]         Phone: [phone]  [facility], located in the [facility]        Address: [address] [place], [place] [address]        Phone: [phone]  [facility], at [facility]       Address: [address], [place], [place] [address]       Phone: [phone]  [facility], at [facility]       Address: [address], [place], [place] [address]       Phone: [phone]

## 2024-02-08 NOTE — Therapy (Signed)
 OUTPATIENT PHYSICAL THERAPY LOWER EXTREMITY EVALUATION   Patient Name: Peggy Patel MRN: [id number] DOB:12/20/1990, 33 y.o., female Today's Date: 02/08/2024  END OF SESSION:  PT End of Session - 02/08/24 1658     Visit Number 1    Number of Visits 4    Date for PT Re-Evaluation 04/09/24    Authorization Type [place] [facility]    PT Start Time 1700    PT Stop Time 1745    PT Time Calculation (min) 45 min    Activity Tolerance Patient tolerated treatment well    Behavior During Therapy [facility] for tasks assessed/performed          History reviewed. No pertinent past medical history. History reviewed. No pertinent surgical history. 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/14/2019   Musculoskeletal back pain 11/14/2019   Menorrhagia 09/24/2019   Anovulatory (dysfunctional uterine) bleeding 09/24/2019   Depression 09/24/2019    PCP: Lafe Domino, DO   REFERRING PROVIDER: Joya Stabs, DPM  REFERRING DIAG: M72.2 (ICD-10-CM) - Plantar fasciitis of left foot M76.822 (ICD-10-CM) - Posterior tibial tendon dysfunction (PTTD) of left lower extremity  THERAPY DIAG:  Plantar fasciitis of left foot  Muscle weakness (generalized)  Other abnormalities of gait and mobility  Rationale for Evaluation and Treatment: Rehabilitation  ONSET DATE: chronic  SUBJECTIVE:   SUBJECTIVE STATEMENT: 33 y.o. female presents for  follow-up of left heel pain. Relates the shot made the foot burn and relates pain in the foot is worse and now going more into her ankle area. She relates difficulty with walking and extreme pain. Meloxicam  has not helped and neither has the brace. Denies any other pedal complaints. Denies n/v/f/c.  Symptoms present for 3 months.  PERTINENT HISTORY: Discussed plantar fasciitis with  patient. Discussed new development of PTTD as well and discussed etiology with patient.  X-rays reviewed and discussed with patient. No acute fractures or dislocations noted. Mild spurring noted at inferior calcaneus. Increase  intensity noted in plantar fascia band. Edema noted.  Discussed treatment options including, ice, NSAIDS, supportive shoes, bracing, and stretching.  Continue stretching.  Amb ref to PT provided.  Prescription for diclofenac  sent to pharmacy  Patient requesting injection today. Procedure note below.   Ankle brace offered today for PTTD but patient refused.   Follow-up 6 weeks or sooner if any problems arise. In the meantime, encouraged to call the office with any questions, concerns, change in symptoms.  PAIN:  Are you having pain? Yes: NPRS scale: 20/10 Pain location: L foot Pain description: ache, burn Aggravating factors: weight bearing positions, activity Relieving factors: rest, non weightbearing  PRECAUTIONS: None  RED FLAGS: None   WEIGHT BEARING RESTRICTIONS: No  FALLS:  Has patient fallen in last 6 months? No   OCCUPATION: cashier  PLOF: Independent  PATIENT GOALS: To manage my foot pain  NEXT MD VISIT: 02/21/24  OBJECTIVE:  Note: Objective measures were completed at Evaluation unless otherwise noted.  DIAGNOSTIC FINDINGS: No acute fractures or dislocations noted. Mild spurring noted at inferior calcaneus. Increase  intensity noted in plantar fascia band. Edema noted.   PATIENT SURVEYS:  LEFS 25/80     MUSCLE LENGTH: deferred  POSTURE: pes planus B  PALPATION: Deferred due to body habitus  LOWER EXTREMITY ROM:  A/PROM Right eval Left eval  Hip flexion  Hip extension    Hip abduction    Hip adduction    Hip internal rotation    Hip external rotation    Knee flexion    Knee extension    Ankle dorsiflexion  0/10d  Ankle plantarflexion    Ankle inversion    Ankle eversion     (Blank rows = not tested)  LOWER  EXTREMITY MMT:  MMT Right eval Left eval  Hip flexion    Hip extension    Hip abduction    Hip adduction    Hip internal rotation    Hip external rotation    Knee flexion    Knee extension    Ankle dorsiflexion    Ankle plantarflexion    Ankle inversion    Ankle eversion     (Blank rows = not tested)  LOWER EXTREMITY SPECIAL TESTS:  Ankle special tests: Anterior drawer test: negative and Talar tilt test: negative  FUNCTIONAL TESTS:  30 seconds chair stand test   GAIT: Distance walked: 12ftx2 Assistive device utilized: None Level of assistance: Complete Independence Comments:                                                                                                                                 TREATMENT:  [facility] Adult PT Treatment:                                                DATE: 02/08/24 Eval and HEP Self Care: Additional minutes spent for educating on updated Therapeutic Home Exercise Program as well as comparing current status to condition at start of symptoms. This included exercises focusing on stretching, strengthening, with focus on eccentric aspects. Long term goals include an improvement in range of motion, strength, endurance as well as avoiding reinjury. Patient's frequency would include in 1-2 times a day, 3-5 times a week for a duration of 6-12 weeks. Proper technique shown and discussed handout in great detail. All questions were discussed and addressed.     PATIENT EDUCATION:  Education details: Discussed eval findings, rehab rationale and POC and patient is in agreement  Person educated: Patient Education method: Explanation and Handouts Education comprehension: verbalized understanding and needs further education  HOME EXERCISE PROGRAM: Access Code: [email or web address] URL: [email or web address] Date: 02/08/2024 Prepared by: Reyes Kohut  Exercises - Long Sitting Plantar Fascia Stretch with Towel  - 3 x daily - 5 x weekly - 1 sets - 2  reps - 60s hold - Heel Toe Raises with Counter Support  - 3 x daily - 5 x weekly - 1 sets - 15 reps - Gastroc Stretch on Wall  - 3 x daily - 5 x weekly - 1 sets - 2 reps - 30s hold - Soleus Stretch on Wall  - 3 x daily - 5 x  weekly - 1 sets - 2 reps - 30s hold  ASSESSMENT:  CLINICAL IMPRESSION: Patient is a 33 y.o. female who was seen today for physical therapy evaluation and treatment for L plantar fascitis.  Symptoms present since 2023.  Patient demos B pes planus, flexibility restrictions in gastroc/soleus complex and hyperextension of L knee.  Strength functional in PF/DF.  Patient a fair rehab candidate due to long standing pain and structural issues as well as unsuccessful conservative treatment to date.  OBJECTIVE IMPAIRMENTS: Abnormal gait, cardiopulmonary status limiting activity, decreased endurance, decreased knowledge of condition, difficulty walking, decreased ROM, decreased strength, postural dysfunction, obesity, and pain.   ACTIVITY LIMITATIONS: carrying, standing, squatting, and stairs  PARTICIPATION LIMITATIONS: weightbearing activities  PERSONAL FACTORS: Fitness, Past/current experiences, and Time since onset of injury/illness/exacerbation are also affecting patient's functional outcome.   REHAB POTENTIAL: Fair due to chronicity, body habitus, structural defects and unsuccessful treatment to date  CLINICAL DECISION MAKING: Stable/uncomplicated  EVALUATION COMPLEXITY: Low   GOALS: Goals reviewed with patient? No  SHORT TERM GOALS=LONG TERM GOALS: Target date: 04/09/24  Patient to demonstrate independence in HEP  Baseline: [email or web address] Goal status: INITIAL  2.  5/15d A/PROM in L DF Baseline: 0/10d Goal status: INITIAL  3.  Patient will score at least 35/80 on LEFS to signify clinically meaningful improvement in functional abilities.   Baseline: 25/80 Goal status: INITIAL  4.  Patient will acknowledge 8/10 pain at least once during episode of care   Baseline:  '20/10 Goal status: INITIAL       PLAN:  PT FREQUENCY: 1-2x/week  PT DURATION: 4 weeks  PLANNED INTERVENTIONS: 97110-Therapeutic exercises, 97530- Therapeutic activity, 97112- Neuromuscular re-education, 97535- Self Care, [id number]- Manual therapy, [phone]- Gait training, Patient/Family education, Balance training, and Stair training  PLAN FOR NEXT SESSION: HEP review and update, manual techniques as appropriate, aerobic tasks, ROM and flexibility activities, strengthening and PREs, TPDN, gait and balance training as needed    For all possible CPT codes, reference the Planned Interventions line above.     Check all conditions that are expected to impact treatment: {Conditions expected to impact treatment:Morbid obesity and Structural or anatomic abnormalities   If treatment provided at initial evaluation, no treatment charged due to lack of authorization.       Ruperto Kiernan M Tamaj Jurgens, PT 02/08/2024, 5:00 PM

## 2024-02-08 NOTE — Progress Notes (Signed)
    SUBJECTIVE:   CHIEF COMPLAINT / HPI:   F/u heavy menstrual bleeding Ongoing even since having IUD placed a few months ago. Passing large clots, going through many heavy pads daily.   Knee pain/back injury Dislocated knee in 2008, has had problems with it since then.  Back injury in 2012 from lifting heavy items at job; has aggravated it over the yeas with other lifting and 2 falls. Has had imaging of both areas, but in another state and not available in Epic. She has taken ibuprofen  and acetaminophen  on and off for years for this issue, but it does not help her discomfort. Impacts daily life, makes ambulation difficult.  TSH Due for TSH recheck, but pt admits she has not been taking her synthroid  regularly because she forgets. Her work hours vary and this makes her schedule difficult.  PERTINENT  PMH / PSH: Reviewed.  OBJECTIVE:   LMP  (LMP Unknown) Comment: states she is bleeding since december 2024/has an iud  General: well-appearing, no acute distress. HEENT: normocephalic, PERRLA, EOM grossly intact Pulm: No increased work of breathing. Extremities: no peripheral edema. Moves all extremities equally. Difficult to examine knee and low back given pt body habitus and discomfort. Neuro: Alert and oriented x3, speech normal in content, no facial asymmetry. Psych:  Cognition and judgment appear intact.  ASSESSMENT/PLAN:   Assessment & Plan Menorrhagia with irregular cycle This has been a chronic problem. Somewhat better since Mirena  placement, but she still has many bleeding days and large clots. She would like to try medication and see Gynecology. Offered pelvic exam today and pt politely declined. - start OCP daily for 1 month - ibuprofen   400 mg daily for 1 week - referral to gynecology placed Chronic midline low back pain without sciatica Back injury in 2012, now with multiple strains/falls due to lifting heavy objects and/or work conditions. Pt reports prior MRI in  another state but I do not have access to this. - lumbar plain film ordered Chronic pain of left knee Knee dislocation in 2008 per pt; again she says this was imaged but I do not have access to images. - knee plain film ordered Hypothyroidism, unspecified type Suspect this may be contributing to menstrual problems. Pt does not take synthroid  regularly. She is due for TSH today however she has not been taking medication daily - I doubt labs would be of much use today. - counseled pt on daily synthroid  112 mcg - needs TSH recheck once compliance is improved Healthcare maintenance Flu shot given today.     Lauraine Norse, DO [facility]

## 2024-02-08 NOTE — Assessment & Plan Note (Signed)
 This has been a chronic problem. Somewhat better since Mirena  placement, but she still has many bleeding days and large clots. She would like to try medication and see Gynecology. Offered pelvic exam today and pt politely declined. - start OCP daily for 1 month - ibuprofen   400 mg daily for 1 week - referral to gynecology placed

## 2024-02-08 NOTE — Assessment & Plan Note (Signed)
 Suspect this may be contributing to menstrual problems. Pt does not take synthroid  regularly. She is due for TSH today however she has not been taking medication daily - I doubt labs would be of much use today. - counseled pt on daily synthroid  112 mcg - needs TSH recheck once compliance is improved

## 2024-02-14 NOTE — Therapy (Deleted)
 OUTPATIENT PHYSICAL THERAPY LOWER EXTREMITY EVALUATION   Patient Name: Peggy Patel MRN: [id number] DOB:August 19, 1990, 33 y.o., female Today's Date: 02/14/2024  END OF SESSION:    No past medical history on file. No past surgical history on file. 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/14/2019   Musculoskeletal back pain 11/14/2019   Menorrhagia 09/24/2019   Anovulatory (dysfunctional uterine) bleeding 09/24/2019   Depression 09/24/2019    PCP: Lafe Domino, DO   REFERRING PROVIDER: Joya Stabs, DPM  REFERRING DIAG: M72.2 (ICD-10-CM) - Plantar fasciitis of left foot M76.822 (ICD-10-CM) - Posterior tibial tendon dysfunction (PTTD) of left lower extremity  THERAPY DIAG:  No diagnosis found.  Rationale for Evaluation and Treatment: Rehabilitation  ONSET DATE: chronic  SUBJECTIVE:   SUBJECTIVE STATEMENT: 33 y.o. female presents for  follow-up of left heel pain. Relates the shot made the foot burn and relates pain in the foot is worse and now going more into her ankle area. She relates difficulty with walking and extreme pain. Meloxicam  has not helped and neither has the brace. Denies any other pedal complaints. Denies n/v/f/c.  Symptoms present for 3 months.  PERTINENT HISTORY: Discussed plantar fasciitis with patient. Discussed new development of PTTD as well and discussed etiology with patient.  X-rays reviewed and discussed with patient. No acute fractures or dislocations noted. Mild spurring noted at inferior calcaneus. Increase  intensity noted in plantar fascia band. Edema noted.  Discussed treatment options including, ice, NSAIDS, supportive shoes, bracing, and stretching.  Continue stretching.  Amb ref to PT provided.  Prescription for diclofenac  sent to pharmacy   Patient requesting injection today. Procedure note below.   Ankle brace offered today for PTTD but patient refused.   Follow-up 6 weeks or sooner if any problems arise. In the meantime, encouraged to call the office with any questions, concerns, change in symptoms.  PAIN:  Are you having pain? Yes: NPRS scale: 20/10 Pain location: L foot Pain description: ache, burn Aggravating factors: weight bearing positions, activity Relieving factors: rest, non weightbearing  PRECAUTIONS: None  RED FLAGS: None   WEIGHT BEARING RESTRICTIONS: No  FALLS:  Has patient fallen in last 6 months? No   OCCUPATION: cashier  PLOF: Independent  PATIENT GOALS: To manage my foot pain  NEXT MD VISIT: 02/21/24  OBJECTIVE:  Note: Objective measures were completed at Evaluation unless otherwise noted.  DIAGNOSTIC FINDINGS: No acute fractures or dislocations noted. Mild spurring noted at inferior calcaneus. Increase  intensity noted in plantar fascia band. Edema noted.   PATIENT SURVEYS:  LEFS 25/80     MUSCLE LENGTH: deferred  POSTURE: pes planus B  PALPATION: Deferred due to body habitus  LOWER EXTREMITY ROM:  A/PROM Right eval Left eval  Hip flexion    Hip extension    Hip abduction    Hip adduction    Hip internal rotation    Hip external rotation    Knee flexion    Knee extension    Ankle dorsiflexion  0/10d  Ankle plantarflexion    Ankle inversion    Ankle eversion     (Blank rows = not tested)  LOWER EXTREMITY MMT:  MMT Right eval Left eval  Hip flexion    Hip extension    Hip abduction    Hip adduction    Hip internal rotation  Hip external rotation    Knee flexion    Knee extension    Ankle dorsiflexion    Ankle plantarflexion    Ankle inversion    Ankle eversion     (Blank rows = not tested)  LOWER EXTREMITY SPECIAL TESTS:  Ankle special tests: Anterior drawer test: negative and Talar tilt test: negative  FUNCTIONAL TESTS:  30 seconds chair  stand test   GAIT: Distance walked: 68ftx2 Assistive device utilized: None Level of assistance: Complete Independence Comments:                                                                                                                                 TREATMENT:  [facility] Adult PT Treatment:                                                DATE: 02/08/24 Eval and HEP Self Care: Additional minutes spent for educating on updated Therapeutic Home Exercise Program as well as comparing current status to condition at start of symptoms. This included exercises focusing on stretching, strengthening, with focus on eccentric aspects. Long term goals include an improvement in range of motion, strength, endurance as well as avoiding reinjury. Patient's frequency would include in 1-2 times a day, 3-5 times a week for a duration of 6-12 weeks. Proper technique shown and discussed handout in great detail. All questions were discussed and addressed.     PATIENT EDUCATION:  Education details: Discussed eval findings, rehab rationale and POC and patient is in agreement  Person educated: Patient Education method: Explanation and Handouts Education comprehension: verbalized understanding and needs further education  HOME EXERCISE PROGRAM: Access Code: [email or web address] URL: [email or web address] Date: 02/08/2024 Prepared by: Reyes Kohut  Exercises - Long Sitting Plantar Fascia Stretch with Towel  - 3 x daily - 5 x weekly - 1 sets - 2 reps - 60s hold - Heel Toe Raises with Counter Support  - 3 x daily - 5 x weekly - 1 sets - 15 reps - Gastroc Stretch on Wall  - 3 x daily - 5 x weekly - 1 sets - 2 reps - 30s hold - Soleus Stretch on Wall  - 3 x daily - 5 x weekly - 1 sets - 2 reps - 30s hold  ASSESSMENT:  CLINICAL IMPRESSION: Patient is a 33 y.o. female who was seen today for physical therapy evaluation and treatment for L plantar fascitis.  Symptoms present since 2023.  Patient demos B pes  planus, flexibility restrictions in gastroc/soleus complex and hyperextension of L knee.  Strength functional in PF/DF.  Patient a fair rehab candidate due to long standing pain and structural issues as well as unsuccessful conservative treatment to date.  OBJECTIVE IMPAIRMENTS: Abnormal gait, cardiopulmonary status limiting activity, decreased endurance, decreased knowledge of condition, difficulty walking,  decreased ROM, decreased strength, postural dysfunction, obesity, and pain.   ACTIVITY LIMITATIONS: carrying, standing, squatting, and stairs  PARTICIPATION LIMITATIONS: weightbearing activities  PERSONAL FACTORS: Fitness, Past/current experiences, and Time since onset of injury/illness/exacerbation are also affecting patient's functional outcome.   REHAB POTENTIAL: Fair due to chronicity, body habitus, structural defects and unsuccessful treatment to date  CLINICAL DECISION MAKING: Stable/uncomplicated  EVALUATION COMPLEXITY: Low   GOALS: Goals reviewed with patient? No  SHORT TERM GOALS=LONG TERM GOALS: Target date: 04/09/24  Patient to demonstrate independence in HEP  Baseline: [id number] Goal status: INITIAL  2.  5/15d A/PROM in L DF Baseline: 0/10d Goal status: INITIAL  3.  Patient will score at least 35/80 on LEFS to signify clinically meaningful improvement in functional abilities.   Baseline: 25/80 Goal status: INITIAL  4.  Patient will acknowledge 8/10 pain at least once during episode of care   Baseline: '20/10 Goal status: INITIAL       PLAN:  PT FREQUENCY: 1-2x/week  PT DURATION: 4 weeks  PLANNED INTERVENTIONS: 97110-Therapeutic exercises, 97530- Therapeutic activity, 97112- Neuromuscular re-education, 97535- Self Care, 02859- Manual therapy, [phone]- Gait training, Patient/Family education, Balance training, and Stair training  PLAN FOR NEXT SESSION: HEP review and update, manual techniques as appropriate, aerobic tasks, ROM and flexibility activities,  strengthening and PREs, TPDN, gait and balance training as needed    For all possible CPT codes, reference the Planned Interventions line above.     Check all conditions that are expected to impact treatment: {Conditions expected to impact treatment:Morbid obesity and Structural or anatomic abnormalities   If treatment provided at initial evaluation, no treatment charged due to lack of authorization.       Kimela Malstrom M [place] Cookson, PT 02/14/2024, 9:33 AM

## 2024-02-15 ENCOUNTER — Encounter

## 2024-02-21 ENCOUNTER — Ambulatory Visit (INDEPENDENT_AMBULATORY_CARE_PROVIDER_SITE_OTHER): Admitting: Podiatry

## 2024-02-21 DIAGNOSIS — Z91199 Patient's noncompliance with other medical treatment and regimen due to unspecified reason: Secondary | ICD-10-CM

## 2024-02-21 NOTE — Therapy (Deleted)
 OUTPATIENT PHYSICAL THERAPY LOWER EXTREMITY EVALUATION   Patient Name: Peggy Patel MRN: [id number] DOB:05-02-1991, 33 y.o., female Today's Date: 02/21/2024  END OF SESSION:    No past medical history on file. No past surgical history on file. 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/14/2019   Musculoskeletal back pain 11/14/2019   Menorrhagia 09/24/2019   Anovulatory (dysfunctional uterine) bleeding 09/24/2019   Depression 09/24/2019    PCP: Lafe Domino, DO   REFERRING PROVIDER: Joya Stabs, DPM  REFERRING DIAG: M72.2 (ICD-10-CM) - Plantar fasciitis of left foot M76.822 (ICD-10-CM) - Posterior tibial tendon dysfunction (PTTD) of left lower extremity  THERAPY DIAG:  No diagnosis found.  Rationale for Evaluation and Treatment: Rehabilitation  ONSET DATE: chronic  SUBJECTIVE:   SUBJECTIVE STATEMENT: 33 y.o. female presents for  follow-up of left heel pain. Relates the shot made the foot burn and relates pain in the foot is worse and now going more into her ankle area. She relates difficulty with walking and extreme pain. Meloxicam  has not helped and neither has the brace. Denies any other pedal complaints. Denies n/v/f/c.  Symptoms present for 3 months.  PERTINENT HISTORY: Discussed plantar fasciitis with patient. Discussed new development of PTTD as well and discussed etiology with patient.  X-rays reviewed and discussed with patient. No acute fractures or dislocations noted. Mild spurring noted at inferior calcaneus. Increase  intensity noted in plantar fascia band. Edema noted.  Discussed treatment options including, ice, NSAIDS, supportive shoes, bracing, and stretching.  Continue stretching.  Amb ref to PT provided.  Prescription for diclofenac  sent to pharmacy   Patient requesting injection today. Procedure note below.   Ankle brace offered today for PTTD but patient refused.   Follow-up 6 weeks or sooner if any problems arise. In the meantime, encouraged to call the office with any questions, concerns, change in symptoms.  PAIN:  Are you having pain? Yes: NPRS scale: 20/10 Pain location: L foot Pain description: ache, burn Aggravating factors: weight bearing positions, activity Relieving factors: rest, non weightbearing  PRECAUTIONS: None  RED FLAGS: None   WEIGHT BEARING RESTRICTIONS: No  FALLS:  Has patient fallen in last 6 months? No   OCCUPATION: cashier  PLOF: Independent  PATIENT GOALS: To manage my foot pain  NEXT MD VISIT: 02/21/24  OBJECTIVE:  Note: Objective measures were completed at Evaluation unless otherwise noted.  DIAGNOSTIC FINDINGS: No acute fractures or dislocations noted. Mild spurring noted at inferior calcaneus. Increase  intensity noted in plantar fascia band. Edema noted.   PATIENT SURVEYS:  LEFS 25/80     MUSCLE LENGTH: deferred  POSTURE: pes planus B  PALPATION: Deferred due to body habitus  LOWER EXTREMITY ROM:  A/PROM Right eval Left eval  Hip flexion    Hip extension    Hip abduction    Hip adduction    Hip internal rotation    Hip external rotation    Knee flexion    Knee extension    Ankle dorsiflexion  0/10d  Ankle plantarflexion    Ankle inversion    Ankle eversion     (Blank rows = not tested)  LOWER EXTREMITY MMT:  MMT Right eval Left eval  Hip flexion    Hip extension    Hip abduction    Hip adduction    Hip internal rotation  Hip external rotation    Knee flexion    Knee extension    Ankle dorsiflexion    Ankle plantarflexion    Ankle inversion    Ankle eversion     (Blank rows = not tested)  LOWER EXTREMITY SPECIAL TESTS:  Ankle special tests: Anterior drawer test: negative and Talar tilt test: negative  FUNCTIONAL TESTS:  30 seconds chair  stand test   GAIT: Distance walked: 31ftx2 Assistive device utilized: None Level of assistance: Complete Independence Comments:                                                                                                                                 TREATMENT:  [facility] Adult PT Treatment:                                                DATE: 02/08/24 Eval and HEP Self Care: Additional minutes spent for educating on updated Therapeutic Home Exercise Program as well as comparing current status to condition at start of symptoms. This included exercises focusing on stretching, strengthening, with focus on eccentric aspects. Long term goals include an improvement in range of motion, strength, endurance as well as avoiding reinjury. Patient's frequency would include in 1-2 times a day, 3-5 times a week for a duration of 6-12 weeks. Proper technique shown and discussed handout in great detail. All questions were discussed and addressed.     PATIENT EDUCATION:  Education details: Discussed eval findings, rehab rationale and POC and patient is in agreement  Person educated: Patient Education method: Explanation and Handouts Education comprehension: verbalized understanding and needs further education  HOME EXERCISE PROGRAM: Access Code: [email or web address] URL: [email or web address] Date: 02/08/2024 Prepared by: Reyes Kohut  Exercises - Long Sitting Plantar Fascia Stretch with Towel  - 3 x daily - 5 x weekly - 1 sets - 2 reps - 60s hold - Heel Toe Raises with Counter Support  - 3 x daily - 5 x weekly - 1 sets - 15 reps - Gastroc Stretch on Wall  - 3 x daily - 5 x weekly - 1 sets - 2 reps - 30s hold - Soleus Stretch on Wall  - 3 x daily - 5 x weekly - 1 sets - 2 reps - 30s hold  ASSESSMENT:  CLINICAL IMPRESSION: Patient is a 33 y.o. female who was seen today for physical therapy evaluation and treatment for L plantar fascitis.  Symptoms present since 2023.  Patient demos B pes  planus, flexibility restrictions in gastroc/soleus complex and hyperextension of L knee.  Strength functional in PF/DF.  Patient a fair rehab candidate due to long standing pain and structural issues as well as unsuccessful conservative treatment to date.  OBJECTIVE IMPAIRMENTS: Abnormal gait, cardiopulmonary status limiting activity, decreased endurance, decreased knowledge of condition, difficulty walking,  decreased ROM, decreased strength, postural dysfunction, obesity, and pain.   ACTIVITY LIMITATIONS: carrying, standing, squatting, and stairs  PARTICIPATION LIMITATIONS: weightbearing activities  PERSONAL FACTORS: Fitness, Past/current experiences, and Time since onset of injury/illness/exacerbation are also affecting patient's functional outcome.   REHAB POTENTIAL: Fair due to chronicity, body habitus, structural defects and unsuccessful treatment to date  CLINICAL DECISION MAKING: Stable/uncomplicated  EVALUATION COMPLEXITY: Low   GOALS: Goals reviewed with patient? No  SHORT TERM GOALS=LONG TERM GOALS: Target date: 04/09/24  Patient to demonstrate independence in HEP  Baseline: [id number] Goal status: INITIAL  2.  5/15d A/PROM in L DF Baseline: 0/10d Goal status: INITIAL  3.  Patient will score at least 35/80 on LEFS to signify clinically meaningful improvement in functional abilities.   Baseline: 25/80 Goal status: INITIAL  4.  Patient will acknowledge 8/10 pain at least once during episode of care   Baseline: '20/10 Goal status: INITIAL       PLAN:  PT FREQUENCY: 1-2x/week  PT DURATION: 4 weeks  PLANNED INTERVENTIONS: 97110-Therapeutic exercises, 97530- Therapeutic activity, 97112- Neuromuscular re-education, 97535- Self Care, 02859- Manual therapy, [phone]- Gait training, Patient/Family education, Balance training, and Stair training  PLAN FOR NEXT SESSION: HEP review and update, manual techniques as appropriate, aerobic tasks, ROM and flexibility activities,  strengthening and PREs, TPDN, gait and balance training as needed    For all possible CPT codes, reference the Planned Interventions line above.     Check all conditions that are expected to impact treatment: {Conditions expected to impact treatment:Morbid obesity and Structural or anatomic abnormalities   If treatment provided at initial evaluation, no treatment charged due to lack of authorization.       Pearlene Teat M Khushbu Pippen, PT 02/21/2024, 2:34 PM

## 2024-02-21 NOTE — Progress Notes (Signed)
 No show

## 2024-02-22 ENCOUNTER — Ambulatory Visit

## 2024-02-28 NOTE — Therapy (Deleted)
 OUTPATIENT PHYSICAL THERAPY LOWER EXTREMITY EVALUATION   Patient Name: Peggy Patel MRN: [id number] DOB:03-16-1991, 33 y.o., female Today's Date: 02/28/2024  END OF SESSION:    No past medical history on file. No past surgical history on file. 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/14/2019   Musculoskeletal back pain 11/14/2019   Menorrhagia 09/24/2019   Anovulatory (dysfunctional uterine) bleeding 09/24/2019   Depression 09/24/2019    PCP: Lafe Domino, DO   REFERRING PROVIDER: Joya Stabs, DPM  REFERRING DIAG: M72.2 (ICD-10-CM) - Plantar fasciitis of left foot M76.822 (ICD-10-CM) - Posterior tibial tendon dysfunction (PTTD) of left lower extremity  THERAPY DIAG:  No diagnosis found.  Rationale for Evaluation and Treatment: Rehabilitation  ONSET DATE: chronic  SUBJECTIVE:   SUBJECTIVE STATEMENT: 33 y.o. female presents for  follow-up of left heel pain. Relates the shot made the foot burn and relates pain in the foot is worse and now going more into her ankle area. She relates difficulty with walking and extreme pain. Meloxicam  has not helped and neither has the brace. Denies any other pedal complaints. Denies n/v/f/c.  Symptoms present for 3 months.  PERTINENT HISTORY: Discussed plantar fasciitis with patient. Discussed new development of PTTD as well and discussed etiology with patient.  X-rays reviewed and discussed with patient. No acute fractures or dislocations noted. Mild spurring noted at inferior calcaneus. Increase  intensity noted in plantar fascia band. Edema noted.  Discussed treatment options including, ice, NSAIDS, supportive shoes, bracing, and stretching.  Continue stretching.  Amb ref to PT provided.  Prescription for diclofenac  sent to pharmacy   Patient requesting injection today. Procedure note below.   Ankle brace offered today for PTTD but patient refused.   Follow-up 6 weeks or sooner if any problems arise. In the meantime, encouraged to call the office with any questions, concerns, change in symptoms.  PAIN:  Are you having pain? Yes: NPRS scale: 20/10 Pain location: L foot Pain description: ache, burn Aggravating factors: weight bearing positions, activity Relieving factors: rest, non weightbearing  PRECAUTIONS: None  RED FLAGS: None   WEIGHT BEARING RESTRICTIONS: No  FALLS:  Has patient fallen in last 6 months? No   OCCUPATION: cashier  PLOF: Independent  PATIENT GOALS: To manage my foot pain  NEXT MD VISIT: 02/21/24  OBJECTIVE:  Note: Objective measures were completed at Evaluation unless otherwise noted.  DIAGNOSTIC FINDINGS: No acute fractures or dislocations noted. Mild spurring noted at inferior calcaneus. Increase  intensity noted in plantar fascia band. Edema noted.   PATIENT SURVEYS:  LEFS 25/80     MUSCLE LENGTH: deferred  POSTURE: pes planus B  PALPATION: Deferred due to body habitus  LOWER EXTREMITY ROM:  A/PROM Right eval Left eval  Hip flexion    Hip extension    Hip abduction    Hip adduction    Hip internal rotation    Hip external rotation    Knee flexion    Knee extension    Ankle dorsiflexion  0/10d  Ankle plantarflexion    Ankle inversion    Ankle eversion     (Blank rows = not tested)  LOWER EXTREMITY MMT:  MMT Right eval Left eval  Hip flexion    Hip extension    Hip abduction    Hip adduction    Hip internal rotation  Hip external rotation    Knee flexion    Knee extension    Ankle dorsiflexion    Ankle plantarflexion    Ankle inversion    Ankle eversion     (Blank rows = not tested)  LOWER EXTREMITY SPECIAL TESTS:  Ankle special tests: Anterior drawer test: negative and Talar tilt test: negative  FUNCTIONAL TESTS:  30 seconds chair  stand test   GAIT: Distance walked: [id number] Assistive device utilized: None Level of assistance: Complete Independence Comments:                                                                                                                                 TREATMENT:  [facility] Adult PT Treatment:                                                DATE: 02/08/24 Eval and HEP Self Care: Additional minutes spent for educating on updated Therapeutic Home Exercise Program as well as comparing current status to condition at start of symptoms. This included exercises focusing on stretching, strengthening, with focus on eccentric aspects. Long term goals include an improvement in range of motion, strength, endurance as well as avoiding reinjury. Patient's frequency would include in 1-2 times a day, 3-5 times a week for a duration of 6-12 weeks. Proper technique shown and discussed handout in great detail. All questions were discussed and addressed.     PATIENT EDUCATION:  Education details: Discussed eval findings, rehab rationale and POC and patient is in agreement  Person educated: Patient Education method: Explanation and Handouts Education comprehension: verbalized understanding and needs further education  HOME EXERCISE PROGRAM: Access Code: [email or web address] URL: [email or web address] Date: 02/08/2024 Prepared by: Reyes Kohut  Exercises - Long Sitting Plantar Fascia Stretch with Towel  - 3 x daily - 5 x weekly - 1 sets - 2 reps - 60s hold - Heel Toe Raises with Counter Support  - 3 x daily - 5 x weekly - 1 sets - 15 reps - Gastroc Stretch on Wall  - 3 x daily - 5 x weekly - 1 sets - 2 reps - 30s hold - Soleus Stretch on Wall  - 3 x daily - 5 x weekly - 1 sets - 2 reps - 30s hold  ASSESSMENT:  CLINICAL IMPRESSION: Patient is a 33 y.o. female who was seen today for physical therapy evaluation and treatment for L plantar fascitis.  Symptoms present since 2023.  Patient demos B pes  planus, flexibility restrictions in gastroc/soleus complex and hyperextension of L knee.  Strength functional in PF/DF.  Patient a fair rehab candidate due to long standing pain and structural issues as well as unsuccessful conservative treatment to date.  OBJECTIVE IMPAIRMENTS: Abnormal gait, cardiopulmonary status limiting activity, decreased endurance, decreased knowledge of condition, difficulty walking,  decreased ROM, decreased strength, postural dysfunction, obesity, and pain.   ACTIVITY LIMITATIONS: carrying, standing, squatting, and stairs  PARTICIPATION LIMITATIONS: weightbearing activities  PERSONAL FACTORS: Fitness, Past/current experiences, and Time since onset of injury/illness/exacerbation are also affecting patient's functional outcome.   REHAB POTENTIAL: Fair due to chronicity, body habitus, structural defects and unsuccessful treatment to date  CLINICAL DECISION MAKING: Stable/uncomplicated  EVALUATION COMPLEXITY: Low   GOALS: Goals reviewed with patient? No  SHORT TERM GOALS=LONG TERM GOALS: Target date: 04/09/24  Patient to demonstrate independence in HEP  Baseline: [email or web address] Goal status: INITIAL  2.  5/15d A/PROM in L DF Baseline: 0/10d Goal status: INITIAL  3.  Patient will score at least 35/80 on LEFS to signify clinically meaningful improvement in functional abilities.   Baseline: 25/80 Goal status: INITIAL  4.  Patient will acknowledge 8/10 pain at least once during episode of care   Baseline: '20/10 Goal status: INITIAL       PLAN:  PT FREQUENCY: 1-2x/week  PT DURATION: 4 weeks  PLANNED INTERVENTIONS: 97110-Therapeutic exercises, 97530- Therapeutic activity, 97112- Neuromuscular re-education, 97535- Self Care, [id number]- Manual therapy, [phone]- Gait training, Patient/Family education, Balance training, and Stair training  PLAN FOR NEXT SESSION: HEP review and update, manual techniques as appropriate, aerobic tasks, ROM and flexibility activities,  strengthening and PREs, TPDN, gait and balance training as needed    For all possible CPT codes, reference the Planned Interventions line above.     Check all conditions that are expected to impact treatment: {Conditions expected to impact treatment:Morbid obesity and Structural or anatomic abnormalities   If treatment provided at initial evaluation, no treatment charged due to lack of authorization.       Hermilo Dutter M Delainie Chavana, PT 02/28/2024, 10:11 AM

## 2024-02-29 ENCOUNTER — Ambulatory Visit

## 2024-03-06 ENCOUNTER — Ambulatory Visit: Admitting: Student

## 2024-03-10 ENCOUNTER — Ambulatory Visit: Admitting: Family Medicine

## 2024-03-17 ENCOUNTER — Ambulatory Visit: Admitting: Family Medicine

## 2024-03-21 ENCOUNTER — Encounter (HOSPITAL_COMMUNITY): Payer: Self-pay
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
## 2024-03-23 ENCOUNTER — Ambulatory Visit (INDEPENDENT_AMBULATORY_CARE_PROVIDER_SITE_OTHER): Payer: Self-pay | Admitting: Psychiatry

## 2024-03-23 ENCOUNTER — Encounter (HOSPITAL_COMMUNITY): Payer: Self-pay | Admitting: Psychiatry

## 2024-03-23 DIAGNOSIS — Z87898 Personal history of other specified conditions: Secondary | ICD-10-CM

## 2024-03-23 DIAGNOSIS — F1290 Cannabis use, unspecified, uncomplicated: Secondary | ICD-10-CM | POA: Diagnosis not present

## 2024-03-23 DIAGNOSIS — F329 Major depressive disorder, single episode, unspecified: Secondary | ICD-10-CM | POA: Diagnosis not present

## 2024-03-23 DIAGNOSIS — Z9152 Personal history of nonsuicidal self-harm: Secondary | ICD-10-CM | POA: Diagnosis not present

## 2024-03-23 DIAGNOSIS — F4310 Post-traumatic stress disorder, unspecified: Secondary | ICD-10-CM | POA: Diagnosis not present

## 2024-03-23 DIAGNOSIS — R4587 Impulsiveness: Secondary | ICD-10-CM

## 2024-03-23 MED ORDER — FLUOXETINE HCL 20 MG PO CAPS: 20.0000 mg | ORAL_CAPSULE | Freq: Every morning | ORAL | 2 refills | Status: DC

## 2024-03-23 NOTE — Patient Instructions (Signed)
 Thank you for attending your appointment today.  -- START Prozac 20 mg daily -- Continue other medications as prescribed.  Please do not make any changes to medications without first discussing with your provider. If you are experiencing a psychiatric emergency, please call 911 or present to your nearest emergency department. Additional crisis, medication management, and therapy resources are included below.  [facility]  [address], [place], [place] [address] [phone] WALK-IN URGENT CARE 24/7 FOR ANYONE [address], [place], [place]  [phone] Fax: [phone] [email or web address] *Interpreters available *Accepts all insurance and uninsured for Urgent Care needs *Accepts Medicaid and uninsured for outpatient treatment (below)      ONLY FOR [facility]  Below:    Outpatient New Patient Assessment/Therapy Walk-ins:        Monday, Wednesday, and Thursday 8am until slots are full (first come, first served)                   New Patient Psychiatry/Medication Management        Monday-Friday 8am-11am (first come, first served)               For all walk-ins we ask that you arrive by 7:15am, because patients will be seen in the order of arrival.

## 2024-03-24 ENCOUNTER — Ambulatory Visit (HOSPITAL_COMMUNITY): Payer: Self-pay | Admitting: Licensed Clinical Social Worker

## 2024-03-24 ENCOUNTER — Encounter (HOSPITAL_COMMUNITY): Payer: Self-pay

## 2024-03-24 ENCOUNTER — Telehealth (HOSPITAL_COMMUNITY): Payer: Self-pay | Admitting: Licensed Clinical Social Worker

## 2024-03-24 NOTE — Telephone Encounter (Signed)
 LCSW sent to links to patient's phone number listed in epic with no response. This was for 10 AM appointment with this LCSW for therapy.   LCSW followed up with a phone call and left HIPAA compliant voicemail.  LCSW disconnected from appointment at [phone] and pt to be marked as a no show.

## 2024-04-26 ENCOUNTER — Encounter (HOSPITAL_COMMUNITY): Payer: Self-pay | Admitting: Psychiatry

## 2024-04-26 ENCOUNTER — Telehealth (INDEPENDENT_AMBULATORY_CARE_PROVIDER_SITE_OTHER): Admitting: Psychiatry

## 2024-04-26 DIAGNOSIS — F411 Generalized anxiety disorder: Secondary | ICD-10-CM

## 2024-04-26 DIAGNOSIS — F332 Major depressive disorder, recurrent severe without psychotic features: Secondary | ICD-10-CM

## 2024-04-26 DIAGNOSIS — F4310 Post-traumatic stress disorder, unspecified: Secondary | ICD-10-CM

## 2024-04-26 MED ORDER — MELATONIN 5 MG PO CAPS: 5.0000 mg | ORAL_CAPSULE | Freq: Every evening | ORAL | 3 refills | Status: DC | PRN

## 2024-04-26 MED ORDER — HYDROXYZINE HCL 10 MG PO TABS: 10.0000 mg | ORAL_TABLET | Freq: Three times a day (TID) | ORAL | 3 refills | Status: DC | PRN

## 2024-04-26 MED ORDER — FLUOXETINE HCL 20 MG PO CAPS: 20.0000 mg | ORAL_CAPSULE | Freq: Every morning | ORAL | 3 refills | Status: DC
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
## 2024-06-02 ENCOUNTER — Observation Stay (HOSPITAL_COMMUNITY): Admission: EM | Admit: 2024-06-02 | Discharge: 2024-06-05 | Disposition: A | Discharge status: Home / Self Care

## 2024-06-02 ENCOUNTER — Other Ambulatory Visit: Payer: Self-pay

## 2024-06-02 DIAGNOSIS — K810 Acute cholecystitis: Secondary | ICD-10-CM | POA: Diagnosis present

## 2024-06-02 DIAGNOSIS — K8012 Calculus of gallbladder with acute and chronic cholecystitis without obstruction: Principal | ICD-10-CM | POA: Insufficient documentation

## 2024-06-02 DIAGNOSIS — E039 Hypothyroidism, unspecified: Secondary | ICD-10-CM | POA: Diagnosis not present

## 2024-06-02 DIAGNOSIS — K819 Cholecystitis, unspecified: Secondary | ICD-10-CM

## 2024-06-02 DIAGNOSIS — R1010 Upper abdominal pain, unspecified: Secondary | ICD-10-CM | POA: Diagnosis present

## 2024-06-02 HISTORY — DX: Thyrotoxicosis, unspecified without thyrotoxic crisis or storm: E05.90

## 2024-06-02 HISTORY — DX: Sleep apnea, unspecified: G47.30

## 2024-06-02 LAB — CBC
HCT: 38.5 % (ref 36.0–46.0)
Hemoglobin: 11.8 g/dL — ABNORMAL LOW (ref 12.0–15.0)
MCH: 25.4 pg — ABNORMAL LOW (ref 26.0–34.0)
MCHC: 30.6 g/dL (ref 30.0–36.0)
MCV: 82.8 fL (ref 80.0–100.0)
Platelets: 319 K/uL (ref 150–400)
RBC: 4.65 MIL/uL (ref 3.87–5.11)
RDW: 16.6 % — ABNORMAL HIGH (ref 11.5–15.5)
WBC: 18.4 K/uL — ABNORMAL HIGH (ref 4.0–10.5)
nRBC: 0 % (ref 0.0–0.2)

## 2024-06-02 LAB — URINALYSIS, ROUTINE W REFLEX MICROSCOPIC
Bilirubin Urine: NEGATIVE
Glucose, UA: NEGATIVE mg/dL
Ketones, ur: NEGATIVE mg/dL
Leukocytes,Ua: NEGATIVE
Nitrite: NEGATIVE
Protein, ur: >=300 mg/dL — AB
Specific Gravity, Urine: 1.029 (ref 1.005–1.030)
pH: 5 (ref 5.0–8.0)

## 2024-06-02 LAB — COMPREHENSIVE METABOLIC PANEL WITH GFR
ALT: 15 U/L (ref 0–44)
AST: 20 U/L (ref 15–41)
Albumin: 4.3 g/dL (ref 3.5–5.0)
Alkaline Phosphatase: 75 U/L (ref 38–126)
Anion gap: 10 (ref 5–15)
BUN: 15 mg/dL (ref 6–20)
CO2: 28 mmol/L (ref 22–32)
Calcium: 9.5 mg/dL (ref 8.9–10.3)
Chloride: 100 mmol/L (ref 98–111)
Creatinine, Ser: 0.72 mg/dL (ref 0.44–1.00)
GFR, Estimated: >60 mL/min
Glucose, Bld: 141 mg/dL — ABNORMAL HIGH (ref 70–99)
Potassium: 4.3 mmol/L (ref 3.5–5.1)
Sodium: 138 mmol/L (ref 135–145)
Total Bilirubin: 0.3 mg/dL (ref 0.0–1.2)
Total Protein: 9 g/dL — ABNORMAL HIGH (ref 6.5–8.1)

## 2024-06-02 LAB — HCG, SERUM, QUALITATIVE: Preg, Serum: NEGATIVE

## 2024-06-02 LAB — LIPASE, BLOOD: Lipase: 13 U/L (ref 11–51)

## 2024-06-02 NOTE — ED Triage Notes (Signed)
 Patient states she drank some water too fast this morning and then 3 hrs ago she started having severe abdominal pain in the RUQ. Patient did not eat between drinking and when the pain starting, endorses nausea.

## 2024-06-02 NOTE — ED Triage Notes (Signed)
 Pt complains of abd RUQ pain that started today. Denies any n/v/d. Pt states it started after drinking a lot of water.

## 2024-06-03 ENCOUNTER — Encounter (HOSPITAL_COMMUNITY)
Admission: EM | Disposition: A | Discharge status: Home / Self Care | Payer: Self-pay | Source: Home / Self Care | Attending: Emergency Medicine

## 2024-06-03 ENCOUNTER — Emergency Department (HOSPITAL_COMMUNITY)

## 2024-06-03 ENCOUNTER — Emergency Department (HOSPITAL_COMMUNITY): Admitting: Anesthesiology

## 2024-06-03 ENCOUNTER — Other Ambulatory Visit: Payer: Self-pay

## 2024-06-03 ENCOUNTER — Encounter (HOSPITAL_COMMUNITY): Payer: Self-pay | Admitting: *Deleted

## 2024-06-03 DIAGNOSIS — G4730 Sleep apnea, unspecified: Secondary | ICD-10-CM

## 2024-06-03 DIAGNOSIS — E039 Hypothyroidism, unspecified: Secondary | ICD-10-CM | POA: Diagnosis not present

## 2024-06-03 DIAGNOSIS — K819 Cholecystitis, unspecified: Secondary | ICD-10-CM

## 2024-06-03 DIAGNOSIS — F418 Other specified anxiety disorders: Secondary | ICD-10-CM | POA: Diagnosis not present

## 2024-06-03 DIAGNOSIS — K810 Acute cholecystitis: Secondary | ICD-10-CM | POA: Diagnosis present

## 2024-06-03 HISTORY — PX: CHOLECYSTECTOMY: SHX55

## 2024-06-03 LAB — CBC
HCT: 38.1 % (ref 36.0–46.0)
Hemoglobin: 11.7 g/dL — ABNORMAL LOW (ref 12.0–15.0)
MCH: 25.1 pg — ABNORMAL LOW (ref 26.0–34.0)
MCHC: 30.7 g/dL (ref 30.0–36.0)
MCV: 81.8 fL (ref 80.0–100.0)
Platelets: 282 K/uL (ref 150–400)
RBC: 4.66 MIL/uL (ref 3.87–5.11)
RDW: 16.9 % — ABNORMAL HIGH (ref 11.5–15.5)
WBC: 17.2 K/uL — ABNORMAL HIGH (ref 4.0–10.5)
nRBC: 0 % (ref 0.0–0.2)

## 2024-06-03 LAB — CREATININE, SERUM
Creatinine, Ser: 0.63 mg/dL (ref 0.44–1.00)
GFR, Estimated: >60 mL/min

## 2024-06-03 SURGERY — LAPAROSCOPIC CHOLECYSTECTOMY
Anesthesia: General | Site: Abdomen

## 2024-06-03 MED ORDER — LIDOCAINE 2% (20 MG/ML) 5 ML SYRINGE
INTRAMUSCULAR | Status: AC
  Filled 2024-06-03: qty 5

## 2024-06-03 MED ORDER — ROCURONIUM BROMIDE 10 MG/ML (PF) SYRINGE
PREFILLED_SYRINGE | INTRAVENOUS | Status: DC | PRN
  Administered 2024-06-03: 70 mg via INTRAVENOUS

## 2024-06-03 MED ORDER — ONDANSETRON HCL 4 MG/2ML IJ SOLN: 4.0000 mg | Freq: Four times a day (QID) | INTRAMUSCULAR | Status: DC | PRN

## 2024-06-03 MED ORDER — SUGAMMADEX SODIUM 200 MG/2ML IV SOLN
INTRAVENOUS | Status: DC | PRN
  Administered 2024-06-03: 320 mg via INTRAVENOUS

## 2024-06-03 MED ORDER — SUCCINYLCHOLINE CHLORIDE 200 MG/10ML IV SOSY
PREFILLED_SYRINGE | INTRAVENOUS | Status: DC | PRN
  Administered 2024-06-03: 200 mg via INTRAVENOUS

## 2024-06-03 MED ORDER — CHLORHEXIDINE GLUCONATE 0.12 % MT SOLN
OROMUCOSAL | Status: AC
  Filled 2024-06-03: qty 15

## 2024-06-03 MED ORDER — FENTANYL CITRATE (PF) 250 MCG/5ML IJ SOLN
INTRAMUSCULAR | Status: AC
  Filled 2024-06-03: qty 5

## 2024-06-03 MED ORDER — ROCURONIUM BROMIDE 10 MG/ML (PF) SYRINGE
PREFILLED_SYRINGE | INTRAVENOUS | Status: AC
  Filled 2024-06-03: qty 10

## 2024-06-03 MED ORDER — MIDAZOLAM HCL 2 MG/2ML IJ SOLN
INTRAMUSCULAR | Status: AC
  Filled 2024-06-03: qty 2

## 2024-06-03 MED ORDER — ONDANSETRON 4 MG PO TBDP: 4.0000 mg | ORAL_TABLET | Freq: Four times a day (QID) | ORAL | Status: DC | PRN

## 2024-06-03 MED ORDER — HYDROMORPHONE HCL 1 MG/ML IJ SOLN
1.0000 mg | INTRAMUSCULAR | Status: DC | PRN
  Administered 2024-06-04: 1 mg via INTRAVENOUS
  Filled 2024-06-03 (×2): qty 1

## 2024-06-03 MED ORDER — MIDAZOLAM HCL (PF) 2 MG/2ML IJ SOLN
INTRAMUSCULAR | Status: DC | PRN
  Administered 2024-06-03: 2 mg via INTRAVENOUS

## 2024-06-03 MED ORDER — HYDROMORPHONE HCL 1 MG/ML IJ SOLN
1.0000 mg | Freq: Once | INTRAMUSCULAR | Status: AC
  Administered 2024-06-03: 1 mg via INTRAVENOUS
  Filled 2024-06-03: qty 1

## 2024-06-03 MED ORDER — LABETALOL HCL 5 MG/ML IV SOLN
INTRAVENOUS | Status: AC
  Filled 2024-06-03: qty 4

## 2024-06-03 MED ORDER — LIDOCAINE 2% (20 MG/ML) 5 ML SYRINGE
INTRAMUSCULAR | Status: DC | PRN
  Administered 2024-06-03: 60 mg via INTRAVENOUS

## 2024-06-03 MED ORDER — SODIUM CHLORIDE 0.9 % IV SOLN
2.0000 g | INTRAVENOUS | Status: DC
  Administered 2024-06-04 – 2024-06-05 (×2): 2 g via INTRAVENOUS
  Filled 2024-06-03 (×2): qty 20

## 2024-06-03 MED ORDER — LACTATED RINGERS IV SOLN: INTRAVENOUS | Status: DC

## 2024-06-03 MED ORDER — SPY AGENT GREEN - (INDOCYANINE FOR INJECTION)
2.5000 mg | Freq: Once | INTRAMUSCULAR | Status: AC
  Administered 2024-06-03: 2.5 mg via INTRAVENOUS

## 2024-06-03 MED ORDER — HYDROMORPHONE HCL 1 MG/ML IJ SOLN
INTRAMUSCULAR | Status: AC
  Filled 2024-06-03: qty 1

## 2024-06-03 MED ORDER — BUPIVACAINE-EPINEPHRINE (PF) 0.25% -1:200000 IJ SOLN
INTRAMUSCULAR | Status: DC | PRN
  Administered 2024-06-03: 30 mL

## 2024-06-03 MED ORDER — BUPIVACAINE-EPINEPHRINE (PF) 0.25% -1:200000 IJ SOLN
INTRAMUSCULAR | Status: AC
  Filled 2024-06-03: qty 30

## 2024-06-03 MED ORDER — PHENYLEPHRINE 80 MCG/ML (10ML) SYRINGE FOR IV PUSH (FOR BLOOD PRESSURE SUPPORT)
PREFILLED_SYRINGE | INTRAVENOUS | Status: DC | PRN
  Administered 2024-06-03: 160 ug via INTRAVENOUS

## 2024-06-03 MED ORDER — ONDANSETRON HCL 4 MG/2ML IJ SOLN
4.0000 mg | Freq: Once | INTRAMUSCULAR | Status: AC
  Administered 2024-06-03: 4 mg via INTRAVENOUS
  Filled 2024-06-03: qty 2

## 2024-06-03 MED ORDER — SODIUM CHLORIDE 0.9 % IV BOLUS
1000.0000 mL | Freq: Once | INTRAVENOUS | Status: AC
  Administered 2024-06-03: 1000 mL via INTRAVENOUS

## 2024-06-03 MED ORDER — HYDROMORPHONE HCL 1 MG/ML IJ SOLN
0.2500 mg | INTRAMUSCULAR | Status: DC | PRN
  Administered 2024-06-03 (×3): 0.5 mg via INTRAVENOUS

## 2024-06-03 MED ORDER — ONDANSETRON HCL 4 MG/2ML IJ SOLN
INTRAMUSCULAR | Status: DC | PRN
  Administered 2024-06-03: 4 mg via INTRAVENOUS

## 2024-06-03 MED ORDER — CYCLOBENZAPRINE HCL 5 MG PO TABS
5.0000 mg | ORAL_TABLET | Freq: Three times a day (TID) | ORAL | Status: DC
  Administered 2024-06-03 – 2024-06-05 (×5): 5 mg via ORAL
  Filled 2024-06-03 (×5): qty 1

## 2024-06-03 MED ORDER — PROPOFOL 10 MG/ML IV BOLUS
INTRAVENOUS | Status: AC
  Filled 2024-06-03: qty 20

## 2024-06-03 MED ORDER — ONDANSETRON HCL 4 MG/2ML IJ SOLN
INTRAMUSCULAR | Status: AC
  Filled 2024-06-03: qty 2

## 2024-06-03 MED ORDER — CHLORHEXIDINE GLUCONATE 0.12 % MT SOLN
15.0000 mL | Freq: Once | OROMUCOSAL | Status: AC
  Administered 2024-06-03: 15 mL via OROMUCOSAL

## 2024-06-03 MED ORDER — SODIUM CHLORIDE 0.9 % IR SOLN
Status: DC | PRN
  Administered 2024-06-03: 1000 mL

## 2024-06-03 MED ORDER — IOHEXOL 350 MG/ML SOLN
75.0000 mL | Freq: Once | INTRAVENOUS | Status: AC | PRN
  Administered 2024-06-03: 75 mL via INTRAVENOUS

## 2024-06-03 MED ORDER — HYDROMORPHONE HCL 1 MG/ML IJ SOLN: 0.2500 mg | INTRAMUSCULAR | Status: DC | PRN

## 2024-06-03 MED ORDER — FENTANYL CITRATE (PF) 250 MCG/5ML IJ SOLN
INTRAMUSCULAR | Status: DC | PRN
  Administered 2024-06-03: 100 ug via INTRAVENOUS
  Administered 2024-06-03: 50 ug via INTRAVENOUS

## 2024-06-03 MED ORDER — DEXAMETHASONE SOD PHOSPHATE PF 10 MG/ML IJ SOLN
INTRAMUSCULAR | Status: DC | PRN
  Administered 2024-06-03: 10 mg via INTRAVENOUS

## 2024-06-03 MED ORDER — ACETAMINOPHEN 500 MG PO TABS
1000.0000 mg | ORAL_TABLET | Freq: Three times a day (TID) | ORAL | Status: DC
  Administered 2024-06-03 – 2024-06-05 (×5): 1000 mg via ORAL
  Filled 2024-06-03 (×5): qty 2

## 2024-06-03 MED ORDER — SODIUM CHLORIDE 0.9 % IV SOLN
2.0000 g | Freq: Once | INTRAVENOUS | Status: AC
  Administered 2024-06-03: 2 g via INTRAVENOUS
  Filled 2024-06-03: qty 20

## 2024-06-03 MED ORDER — 0.9 % SODIUM CHLORIDE (POUR BTL) OPTIME
TOPICAL | Status: DC | PRN
  Administered 2024-06-03: 1000 mL

## 2024-06-03 MED ORDER — PROPOFOL 10 MG/ML IV BOLUS
INTRAVENOUS | Status: DC | PRN
  Administered 2024-06-03: 200 mg via INTRAVENOUS

## 2024-06-03 MED ORDER — PHENYLEPHRINE HCL-NACL 20-0.9 MG/250ML-% IV SOLN
INTRAVENOUS | Status: DC | PRN
  Administered 2024-06-03: 25 ug/min via INTRAVENOUS

## 2024-06-03 MED ORDER — ORAL CARE MOUTH RINSE: 15.0000 mL | Freq: Once | OROMUCOSAL | Status: AC

## 2024-06-03 MED ORDER — METRONIDAZOLE 500 MG/100ML IV SOLN
500.0000 mg | Freq: Once | INTRAVENOUS | Status: AC
  Administered 2024-06-03: 500 mg via INTRAVENOUS
  Filled 2024-06-03: qty 100

## 2024-06-03 MED ORDER — OXYCODONE HCL 5 MG PO TABS
5.0000 mg | ORAL_TABLET | Freq: Four times a day (QID) | ORAL | Status: DC | PRN
  Administered 2024-06-03 – 2024-06-05 (×5): 5 mg via ORAL
  Filled 2024-06-03 (×5): qty 1

## 2024-06-03 MED ORDER — ENOXAPARIN SODIUM 40 MG/0.4ML IJ SOSY
40.0000 mg | PREFILLED_SYRINGE | INTRAMUSCULAR | Status: DC
  Administered 2024-06-04 – 2024-06-05 (×2): 40 mg via SUBCUTANEOUS
  Filled 2024-06-03 (×2): qty 0.4

## 2024-06-03 SURGICAL SUPPLY — 38 items
APPLICATOR ARISTA FLEXITIP XL (MISCELLANEOUS) IMPLANT
BAG COUNTER SPONGE SURGICOUNT (BAG) ×1 IMPLANT
CANISTER SUCTION 3000ML PPV (SUCTIONS) ×1 IMPLANT
CHLORAPREP W/TINT 26 (MISCELLANEOUS) ×1 IMPLANT
CLIP APPLIE ROT 10 11.4 M/L (STAPLE) ×1 IMPLANT
CLIP LIGATING HEMO O LOK GREEN (MISCELLANEOUS) IMPLANT
COVER SURGICAL LIGHT HANDLE (MISCELLANEOUS) ×1 IMPLANT
DERMABOND ADVANCED .7 DNX12 (GAUZE/BANDAGES/DRESSINGS) ×1 IMPLANT
ELECTRODE REM PT RTRN 9FT ADLT (ELECTROSURGICAL) ×1 IMPLANT
ENDOLOOP SUT PDS II 0 18 (SUTURE) IMPLANT
GLOVE BIO SURGEON STRL SZ7 (GLOVE) ×1 IMPLANT
GOWN STRL REUS W/ TWL LRG LVL3 (GOWN DISPOSABLE) ×2 IMPLANT
GOWN STRL REUS W/ TWL XL LVL3 (GOWN DISPOSABLE) ×1 IMPLANT
GRASPER SUT TROCAR 14GX15 (MISCELLANEOUS) ×1 IMPLANT
HEMOSTAT ARISTA ABSORB 3G PWDR IMPLANT
IRRIGATION SUCT STRKRFLW 2 WTP (MISCELLANEOUS) ×1 IMPLANT
KIT BASIN OR (CUSTOM PROCEDURE TRAY) ×1 IMPLANT
KIT IMAGING PINPOINTPAQ (MISCELLANEOUS) IMPLANT
KIT TURNOVER KIT B (KITS) ×1 IMPLANT
LHOOK LAP DISP 36CM (ELECTROSURGICAL) ×1 IMPLANT
NEEDLE 22X1.5 STRL (OR ONLY) (MISCELLANEOUS) ×1 IMPLANT
NEEDLE INSUFFLATION 14GA 120MM (NEEDLE) ×1 IMPLANT
PAD ARMBOARD POSITIONER FOAM (MISCELLANEOUS) ×1 IMPLANT
PENCIL BUTTON HOLSTER BLD 10FT (ELECTRODE) ×1 IMPLANT
POUCH RETRIEVAL ECOSAC 10 (ENDOMECHANICALS) ×1 IMPLANT
SCISSORS LAP 5X35 DISP (ENDOMECHANICALS) ×1 IMPLANT
SET TUBE SMOKE EVAC HIGH FLOW (TUBING) ×1 IMPLANT
SLEEVE Z-THREAD 5X100MM (TROCAR) ×2 IMPLANT
SOLN 0.9% NACL POUR BTL 1000ML (IV SOLUTION) ×1 IMPLANT
SOLN STERILE WATER BTL 1000 ML (IV SOLUTION) ×1 IMPLANT
SUT MNCRL AB 4-0 PS2 18 (SUTURE) ×1 IMPLANT
SUT VICRYL 0 UR6 27IN ABS (SUTURE) IMPLANT
TOWEL GREEN STERILE (TOWEL DISPOSABLE) ×1 IMPLANT
TOWEL GREEN STERILE FF (TOWEL DISPOSABLE) ×1 IMPLANT
TRAY LAPAROSCOPIC MC (CUSTOM PROCEDURE TRAY) ×1 IMPLANT
TROCAR Z THREAD OPTICAL 12X100 (TROCAR) ×1 IMPLANT
TROCAR Z-THREAD OPTICAL 5X100M (TROCAR) ×1 IMPLANT
WARMER LAPAROSCOPE (MISCELLANEOUS) ×1 IMPLANT

## 2024-06-03 NOTE — Anesthesia Preprocedure Evaluation (Addendum)
"                                    Anesthesia Evaluation  Patient identified by MRN, date of birth, ID band Patient awake    Reviewed: Allergy & Precautions, H&P , NPO status , Patient's Chart, lab work & pertinent test results  Airway Mallampati: III  TM Distance: >3 FB Neck ROM: Full    Dental no notable dental hx. (+) Teeth Intact, Dental Advisory Given   Pulmonary sleep apnea    Pulmonary exam normal breath sounds clear to auscultation       Cardiovascular negative cardio ROS  Rhythm:Regular Rate:Normal     Neuro/Psych   Anxiety Depression    negative neurological ROS     GI/Hepatic negative GI ROS, Neg liver ROS,,,  Endo/Other  Hypothyroidism  Class 4 obesity  Renal/GU negative Renal ROS  negative genitourinary   Musculoskeletal   Abdominal   Peds  Hematology negative hematology ROS (+)   Anesthesia Other Findings   Reproductive/Obstetrics negative OB ROS                              Anesthesia Physical Anesthesia Plan  ASA: 4  Anesthesia Plan: General   Post-op Pain Management: Ofirmev  IV (intra-op)*   Induction: Intravenous  PONV Risk Score and Plan: 4 or greater and Ondansetron , Dexamethasone  and Midazolam   Airway Management Planned: Oral ETT and Video Laryngoscope Planned  Additional Equipment:   Intra-op Plan:   Post-operative Plan: Extubation in OR  Informed Consent: I have reviewed the patients History and Physical, chart, labs and discussed the procedure including the risks, benefits and alternatives for the proposed anesthesia with the patient or authorized representative who has indicated his/her understanding and acceptance.     Dental advisory given  Plan Discussed with: CRNA  Anesthesia Plan Comments:          Anesthesia Quick Evaluation  "

## 2024-06-03 NOTE — Anesthesia Procedure Notes (Addendum)
 Procedure Name: Intubation Date/Time: 06/03/2024 10:20 AM  Performed by: Zelphia Norleen HERO, CRNAPre-anesthesia Checklist: Patient identified, Emergency Drugs available, Suction available and Patient being monitored Patient Re-evaluated:Patient Re-evaluated prior to induction Oxygen Delivery Method: Circle system utilized Preoxygenation: Pre-oxygenation with 100% oxygen Induction Type: IV induction and Rapid sequence Laryngoscope Size: Mac and 3 Grade View: Grade II Tube type: Oral Tube size: 7.0 mm Number of attempts: 1 Airway Equipment and Method: Stylet Placement Confirmation: ETT inserted through vocal cords under direct vision, positive ETCO2 and breath sounds checked- equal and bilateral Secured at: 23 cm Tube secured with: Tape Dental Injury: Teeth and Oropharynx as per pre-operative assessment

## 2024-06-03 NOTE — Op Note (Signed)
 Patient: Peggy Patel MRN: [id number] DOB: 07-05-1990 Sex: female Operation/Procedure Date: 06/03/2024 Surgeons and Role:    * Jeryl Umholtz, Cordella LABOR, MD - Primary    DEWAINE Eletha Boas, MD - Assisting  A surgeon assistant was essential to the completion of the case given the acuity of the situation and the patient's body habitus, which made exposure and dissection a challenge  Pre-operative Diagnoses: cholecystitis Postoperative Diagnoses: cholecystitis  Procedure performed: Procedure:    LAPAROSCOPIC CHOLECYSTECTOMY CPT(R) Code:  52437 - PR LAPAROSCOPY SURG CHOLECYSTECTOMY  Anesthesia: General endotracheal anesthesia  Indications: Peggy Patel is a 33 year old female who presented to the ED with abdominal pain and nausea. Her workup was consistent with acute cholecystitis and she was offered cholecystectomy. Preoperatively, I discussed in detail the risks, benefits, alternatives, and potential complications. The patient understands and requests to proceed.  Operative Findings: Significant inflammation consistent with acute cholecystitis.  Operative Narrative: The patient was positively identified and was taken to the operating room and placed supine on the operating table. A time-out was performed confirming correct patient and procedure. We also confirmed initiation of deep venous thrombosis prophylaxis and wound prophylaxis. After successful induction of general endotracheal anesthesia, the arms were carefully padded. An orogastric tube and footboard were placed. The abdomen was prepped and draped in the usual sterile surgical fashion.  We began our peritoneal access with a veress needle inserted at Palmer's point.  After aspiration showed return of air bubbles and there was a positive saline drop test, the insufflation was connected and the abdomen brought to a pressure of .  We then used an opti-view technique to place a 5mm port just to the right of midline, superior to the  umbilicus.  A laparoscope was introduced into the abdomen, and there were no signs of injury from entry.  A 12mm port was placed in the subxiphoid position.  Two additional 5mm ports were placed in the RUQ.  A 360-degree visualization with a 30-degree 5-mm laparoscope revealed grossly normal intra-abdominal contents.   The patient was placed in the head up position and tilted slightly to the left. There was significant inflammation, making it difficult to grasp the gallbladder.  A needle was used to aspirate the contents of the gallbladder, which revealed signs of hydrops.  The dome of the gallbladder was then grasped, elevated, and retracted anteriorly and cephalad.  She had a significant amount of intra-abdominal fat, which made exposure of the critical structures a challenge. I chose to place another 5mm port under direct visualization in the LUQ to allow for additional retraction. The infundibulum was retracted laterally and inferiorly exposing Calot's triangle. The investing visceral peritoneal attachments overlying the infundibulum of the gallbladder were incised using the hook electrocautery and dissected free from the gallbladder itself. We soon developed two structures into the gallbladder consistent with the cystic duct and cystic artery. The loose areolar tissue around these structures was dissected free. The gallbladder was separated from the gallbladder fossa for approximately a third the distance up from the cystic plate, establishing the critical view of safety. The cystic duct and cystic artery were triply clipped. These were divided using laparoscopic scissors leaving a single clip on the removal side. The gallbladder was elevated off the gallbladder fossa using the hook Bovie. The gallbladder was then exteriorized through the subxiphoid port site using an EndoCatch bag. We reestablished pneumoperitoneum and confirmed no leakage of blood or bile. We confirmed integrity of our clips. The subhepatic  space was irrigated with warm  sterile saline and suctioned free. The 12mm port site was closed using an 0-vicryl on a suture passer.We placed 0.25% Marcaine  with epinephrine  at each incision site for local anesthesia. The skin was closed using 4-0 Monocryl subcuticular suture. Dermabond was applied. The patient tolerated the procedure well, was extubated, and taken to the recovery room.  Estimated Blood Loss: Minimal  Specimens: Gallbladder Implants: None Drains: None Complications: None Condition of the patient: Good, extubated Disposition: PACU  Cordella DELENA Idler Date: 06/03/2024 Time: 12:21 PM

## 2024-06-03 NOTE — ED Notes (Signed)
 Patient transported to CT scan .

## 2024-06-03 NOTE — ED Provider Notes (Signed)
 " [facility]-EMERGENCY DEPT [facility] Emergency Department Provider Note MRN:  [id number]  Arrival date & time: 06/03/2024     Chief Complaint   Abdominal Pain   History of Present Illness   Peggy Patel is a 33 y.o. year-old female with no previous medical presenting to the ED with chief complaint of abdominal pain.  Upper abdominal pain for the past several hours started after drinking a lot of water.  Has gone away.  Pain is severe.  Denies fever, no nausea vomiting or diarrhea.  Review of Systems  A thorough review of systems was obtained and all systems are negative except as noted in the HPI and PMH.   Patient's Health History    Past Medical History:  Diagnosis Date   Depression    PTSD (post-traumatic stress disorder)     No past surgical history on file.  Family History  Problem Relation Age of Onset   Alcohol abuse Mother    Post-traumatic stress disorder Father     Social History   Socioeconomic History   Marital status: Single    Spouse name: Not on file   Number of children: Not on file   Years of education: Not on file   Highest education level: 12th grade  Occupational History   Not on file  Tobacco Use   Smoking status: Never    Passive exposure: Never   Smokeless tobacco: Never  Vaping Use   Vaping status: Some Days  Substance and Sexual Activity   Alcohol use: Not Currently   Drug use: Yes    Types: Marijuana    Comment: 2-4 blunts daily   Sexual activity: Not on file  Other Topics Concern   Not on file  Social History Narrative   Not on file   Social Drivers of Health   Tobacco Use: Low Risk (04/26/2024)   Patient History    Smoking Tobacco Use: Never    Smokeless Tobacco Use: Never    Passive Exposure: Never  Financial Resource Strain: High Risk (01/10/2024)   Overall Financial Resource Strain (CARDIA)    Difficulty of Paying Living Expenses: Very hard  Food Insecurity: Food Insecurity Present (01/10/2024)   Epic    Worried  About Programme Researcher, Broadcasting/film/video in the Last Year: Sometimes true    Ran Out of Food in the Last Year: Sometimes true  Transportation Needs: No Transportation Needs (01/10/2024)   Epic    Lack of Transportation (Medical): No    Lack of Transportation (Non-Medical): No  Physical Activity: Inactive (01/10/2024)   Exercise Vital Sign    Days of Exercise per Week: 0 days    Minutes of Exercise per Session: Not on file  Stress: Stress Concern Present (01/10/2024)   [employer] of Occupational Health - Occupational Stress Questionnaire    Feeling of Stress: Very much  Social Connections: Unknown (01/10/2024)   Social Connection and Isolation Panel    Frequency of Communication with Friends and Family: Once a week    Frequency of Social Gatherings with Friends and Family: Once a week    Attends Religious Services: Never    Database Administrator or Organizations: No    Attends Engineer, Structural: Not on file    Marital Status: Patient declined  Intimate Partner Violence: Not At Risk (08/06/2023)   Humiliation, Afraid, Rape, and Kick questionnaire    Fear of Current or Ex-Partner: No    Emotionally Abused: No    Physically Abused: No  Sexually Abused: No  Depression (PHQ2-9): High Risk (04/26/2024)   Depression (PHQ2-9)    PHQ-2 Score: 24  Alcohol Screen: Low Risk (01/10/2024)   Alcohol Screen    Last Alcohol Screening Score (AUDIT): 3  Housing: High Risk (01/10/2024)   Epic    Unable to Pay for Housing in the Last Year: Yes    Number of Times Moved in the Last Year: 0    Homeless in the Last Year: No  Utilities: Not At Risk (08/06/2023)   AHC Utilities    Threatened with loss of utilities: No  Health Literacy: Not on file     Physical Exam   Vitals:   06/03/24 0500 06/03/24 0600  BP: (!) 148/91 (!) 146/86  Pulse: 91 93  Resp:    Temp:    SpO2: 92% 94%    CONSTITUTIONAL: Well-appearing, moderate distress due to pain NEURO/PSYCH:  Alert and oriented x 3, no focal  deficits EYES:  eyes equal and reactive ENT/NECK:  no LAD, no JVD CARDIO: Regular rate, well-perfused, normal S1 and S2 PULM:  CTAB no wheezing or rhonchi GI/GU:  non-distended, non-tender MSK/SPINE:  No gross deformities, no edema SKIN:  no rash, atraumatic   *Additional and/or pertinent findings included in MDM below  Diagnostic and Interventional Summary    EKG Interpretation Date/Time:    Ventricular Rate:    PR Interval:    QRS Duration:    QT Interval:    QTC Calculation:   R Axis:      Text Interpretation:         Labs Reviewed  COMPREHENSIVE METABOLIC PANEL WITH GFR - Abnormal; Notable for the following components:      Result Value   Glucose, Bld 141 (*)    Total Protein 9.0 (*)    All other components within normal limits  CBC - Abnormal; Notable for the following components:   WBC 18.4 (*)    Hemoglobin 11.8 (*)    MCH 25.4 (*)    RDW 16.6 (*)    All other components within normal limits  URINALYSIS, ROUTINE W REFLEX MICROSCOPIC - Abnormal; Notable for the following components:   APPearance HAZY (*)    Hgb urine dipstick SMALL (*)    Protein, ur >=300 (*)    Bacteria, UA RARE (*)    All other components within normal limits  LIPASE, BLOOD  HCG, SERUM, QUALITATIVE    CT ABDOMEN PELVIS W CONTRAST  Final Result      Medications  sodium chloride  0.9 % bolus 1,000 mL (0 mLs Intravenous Stopped 06/03/24 0551)  ondansetron  (ZOFRAN ) injection 4 mg (4 mg Intravenous Given 06/03/24 0419)  HYDROmorphone  (DILAUDID ) injection 1 mg (1 mg Intravenous Given 06/03/24 0418)  iohexol  (OMNIPAQUE ) 350 MG/ML injection 75 mL (75 mLs Intravenous Contrast Given 06/03/24 0439)  cefTRIAXone  (ROCEPHIN ) 2 g in sodium chloride  0.9 % 100 mL IVPB (0 g Intravenous Stopped 06/03/24 0550)  metroNIDAZOLE  (FLAGYL ) IVPB 500 mg (0 mg Intravenous Stopped 06/03/24 0611)  HYDROmorphone  (DILAUDID ) injection 1 mg (1 mg Intravenous Given 06/03/24 0553)     Procedures  /  Critical  Care .Critical Care  Performed by: Theadore Ozell HERO, MD Authorized by: Theadore Ozell HERO, MD   Critical care provider statement:    Critical care time (minutes):  35   Critical care was necessary to treat or prevent imminent or life-threatening deterioration of the following conditions: acute cholecystitis.   Critical care was time spent personally by me on the following activities:  Development of treatment plan with patient or surrogate, discussions with consultants, evaluation of patient's response to treatment, examination of patient, ordering and review of laboratory studies, ordering and review of radiographic studies, ordering and performing treatments and interventions, pulse oximetry, re-evaluation of patient's condition and review of old charts   ED Course and Medical Decision Making  Initial Impression and Ddx Differential diagnosis includes pancreatitis, perforated viscus, gastritis, biliary colic  Past medical/surgical history that increases complexity of ED encounter: Obesity, gallstones  Interpretation of Diagnostics I personally reviewed the Laboratory Testing and my interpretation is as follows: Leukocytosis of 18  CT showing evidence concerning for acute cholecystitis  Patient Reassessment and Ultimate Disposition/Management     Starting antibiotics, will consult surgery for management.  Patient management required discussion with the following services or consulting groups:  General/Trauma Surgery  Complexity of Problems Addressed Acute illness or injury that poses threat of life of bodily function  Additional Data Reviewed and Analyzed Further history obtained from: Further history from spouse/family member  Additional Factors Impacting ED Encounter Risk Use of parenteral controlled substances and Consideration of hospitalization  Ozell HERO. Theadore, MD [facility] [email or web address]  Final Clinical  Impressions(s) / ED Diagnoses     ICD-10-CM   1. Pain of upper abdomen  R10.10     2. Cholecystitis  K81.9       ED Discharge Orders     None        Discharge Instructions Discussed with and Provided to Patient:   Discharge Instructions   None      Theadore Ozell HERO, MD 06/03/24 [phone]  "

## 2024-06-03 NOTE — Progress Notes (Signed)
 Please see Dr. Jomarie note for a complete H&P.   Patient was seen and examined in the pre-operative area. We discussed the benefits and potential risks of cholecystectomy. - Will proceed to the OR. We discussed the alternatives and potential risks of surgery, including but not limited to: bleeding, infection, damage to bowel or surrounding structures, bile leak, pancreatitis, retained stone, damage to the biliary system, and need for additional procedures. All questions were addressed and consent was obtained.    Cordella Idler, MD   General Surgeon [facility], [place]

## 2024-06-03 NOTE — Anesthesia Postprocedure Evaluation (Signed)
"   Anesthesia Post Note  Patient: Estate Manager/land Agent  Procedure(s) Performed: LAPAROSCOPIC CHOLECYSTECTOMY (Abdomen)     Patient location during evaluation: PACU Anesthesia Type: General Level of consciousness: awake and alert Pain management: pain level controlled Vital Signs Assessment: post-procedure vital signs reviewed and stable Respiratory status: spontaneous breathing, nonlabored ventilation, respiratory function stable and patient connected to nasal cannula oxygen Cardiovascular status: blood pressure returned to baseline and stable Postop Assessment: no apparent nausea or vomiting Anesthetic complications: no   No notable events documented.  Last Vitals:  Vitals:   06/03/24 1430 06/03/24 1445  BP: (!) 146/88 (!) 152/91  Pulse: 85 87  Resp: 20 (!) 31  Temp:    SpO2: 92% 93%    Last Pain:  Vitals:   06/03/24 1413  TempSrc:   PainSc: 7                  Andyn Sales,W. EDMOND      "

## 2024-06-03 NOTE — H&P (Signed)
 "   Reason for Consult/Chief Complaint: acute cholecystitis Consultant: Theadore, MD  Peggy Patel is an 33 y.o. female.   HPI: [name] with acute onset BUQ R>L abdominal pain and nausea that began at 1300 on 12/26. She forced herself to throw up, which did not improve her symptoms. Last BM 12/26. No prior abdominal surgery. Morbidly obese.   Past Medical History:  Diagnosis Date   Depression    PTSD (post-traumatic stress disorder)     No past surgical history on file.  Family History  Problem Relation Age of Onset   Alcohol abuse Mother    Post-traumatic stress disorder Father     Social History:  reports that she has never smoked. She has never been exposed to tobacco smoke. She has never used smokeless tobacco. She reports that she does not currently use alcohol. She reports current drug use. Drug: Marijuana.  Allergies: Allergies[1]  Medications: I have reviewed the patient's current medications.  Results for orders placed or performed during the hospital encounter of 06/02/24 (from the past 48 hours)  Urinalysis, Routine w reflex microscopic -Urine, Clean Catch     Status: Abnormal   Collection Time: 06/02/24  9:41 PM  Result Value Ref Range   Color, Urine YELLOW YELLOW   APPearance HAZY (A) CLEAR   Specific Gravity, Urine 1.029 1.005 - 1.030   pH 5.0 5.0 - 8.0   Glucose, UA NEGATIVE NEGATIVE mg/dL   Hgb urine dipstick SMALL (A) NEGATIVE   Bilirubin Urine NEGATIVE NEGATIVE   Ketones, ur NEGATIVE NEGATIVE mg/dL   Protein, ur >=699 (A) NEGATIVE mg/dL   Nitrite NEGATIVE NEGATIVE   Leukocytes,Ua NEGATIVE NEGATIVE   RBC / HPF 0-5 0 - 5 RBC/hpf   WBC, UA 0-5 0 - 5 WBC/hpf   Bacteria, UA RARE (A) NONE SEEN   Squamous Epithelial / HPF 0-5 0 - 5 /HPF   Mucus PRESENT     Comment: Performed at [facility], [address], [place], [place] [address]  Lipase, blood     Status: None   Collection Time: 06/02/24  9:43 PM  Result Value Ref Range   Lipase 13 11 - 51 U/L     Comment: Performed at [facility], [address], [place], [place] [address]  Comprehensive metabolic panel     Status: Abnormal   Collection Time: 06/02/24  9:43 PM  Result Value Ref Range   Sodium 138 135 - 145 mmol/L   Potassium 4.3 3.5 - 5.1 mmol/L   Chloride 100 98 - 111 mmol/L   CO2 28 22 - 32 mmol/L   Glucose, Bld 141 (H) 70 - 99 mg/dL    Comment: Glucose reference range applies only to samples taken after fasting for at least 8 hours.   BUN 15 6 - 20 mg/dL   Creatinine, Ser 9.27 0.44 - 1.00 mg/dL   Calcium 9.5 8.9 - 89.6 mg/dL   Total Protein 9.0 (H) 6.5 - 8.1 g/dL   Albumin 4.3 3.5 - 5.0 g/dL   AST 20 15 - 41 U/L   ALT 15 0 - 44 U/L   Alkaline Phosphatase 75 38 - 126 U/L   Total Bilirubin 0.3 0.0 - 1.2 mg/dL   GFR, Estimated >39 >39 mL/min    Comment: (NOTE) Calculated using the CKD-EPI Creatinine Equation (2021)    Anion gap 10 5 - 15    Comment: Performed at [facility], [address] [place], [place] [address]  CBC  Status: Abnormal   Collection Time: 06/02/24  9:43 PM  Result Value Ref Range   WBC 18.4 (H) 4.0 - 10.5 K/uL   RBC 4.65 3.87 - 5.11 MIL/uL   Hemoglobin 11.8 (L) 12.0 - 15.0 g/dL   HCT 61.4 63.9 - 53.9 %   MCV 82.8 80.0 - 100.0 fL   MCH 25.4 (L) 26.0 - 34.0 pg   MCHC 30.6 30.0 - 36.0 g/dL   RDW 83.3 (H) 88.4 - 84.4 %   Platelets 319 150 - 400 K/uL   nRBC 0.0 0.0 - 0.2 %    Comment: Performed at [facility], [address] [place], [place] [address]  hCG, serum, qualitative     Status: None   Collection Time: 06/02/24  9:43 PM  Result Value Ref Range   Preg, Serum NEGATIVE NEGATIVE    Comment:        THE SENSITIVITY OF THIS METHODOLOGY IS >10 mIU/mL. Performed at [facility], [address], [place], [place] [address]     CT ABDOMEN PELVIS W CONTRAST Result Date: 06/03/2024 EXAM: CT ABDOMEN AND PELVIS WITH CONTRAST 06/03/2024 04:38:50 AM TECHNIQUE: CT of the abdomen and pelvis was performed with the  administration of 75 mL of iohexol  (OMNIPAQUE ) 350 MG/ML injection. Multiplanar reformatted images are provided for review. Automated exposure control, iterative reconstruction, and/or weight-based adjustment of the mA/kV was utilized to reduce the radiation dose to as low as reasonably achievable. COMPARISON: Pelvis ultrasound 12/07/2023. CLINICAL HISTORY: 33 year old female with acute, nonlocalized abdominal pain and acute onset right upper quadrant pain. FINDINGS: LOWER CHEST: No acute abnormality. LIVER: Liver enhancement remains within normal limits. GALLBLADDER AND BILE DUCTS: The gallbladder wall appears indistinct throughout, and there is a large lipid laden stone in the gallbladder fundus measuring 2.7 mm. See coronal image 101 demonstrating either trace pericholecystic fluid or abnormal gallbladder wall thickening. Bile ducts are nonenlarged. SPLEEN: No acute abnormality. PANCREAS: No acute abnormality. ADRENAL GLANDS: No acute abnormality. KIDNEYS, URETERS AND BLADDER: No stones in the kidneys or ureters. No hydronephrosis. No perinephric or periureteral stranding. Diminutive bladder. GI AND BOWEL: Small volume retained fluid in the stomach. Decompressed duodenum. Normal gas containing appendix tracking medial from the cecum on series 3 image 62, coronal image 83. Negative large bowel. Nondilated small bowel. There is no bowel obstruction. PERITONEUM AND RETROPERITONEUM: No ascites. No free air. VASCULATURE: Suboptimal intravascular contrast bolus but the major arterial structures and portal venous system appear patent and unremarkable. Aorta is normal in caliber. LYMPH NODES: No lymphadenopathy. REPRODUCTIVE ORGANS: No acute abnormality. BONES AND SOFT TISSUES: No acute osseous abnormality. No focal soft tissue abnormality. IMPRESSION: 1. Abnormal gallbladder, with a 2.7 cm stone in the fundus and CT evidence of abnormal wall thickening. 2. Constellation of clinical and imaging findings strongly  suggestive of acute cholecystitis. Right upper quadrant ultrasound would be complementary. 3. Normal appendix. No other acute or inflammatory process identified in the abdomen or pelvis. Electronically signed by: Helayne Hurst MD 06/03/2024 04:52 AM EST RP Workstation: [id number]    ROS 10 point review of systems is negative except as listed above in HPI.   Physical Exam Blood pressure (!) 146/86, pulse 93, temperature 98.6 F (37 C), temperature source Oral, resp. rate 20, weight (!) 160 kg, SpO2 94%. Constitutional: well-developed, well-nourished HEENT: pupils equal, round, reactive to light, 2mm b/l, moist conjunctiva, external inspection of ears and nose normal, hearing intact Oropharynx: normal oropharyngeal mucosa, normal dentition Neck: no thyromegaly, trachea midline, no  midline cervical tenderness to palpation Chest: breath sounds equal bilaterally, normal respiratory effort, no midline or lateral chest wall tenderness to palpation/deformity Abdomen: soft, RUQ TTP, no bruising, no hepatosplenomegaly Skin: warm, dry, no rashes Psych: normal memory, normal mood/affect     Assessment/Plan: Acute cholecystitis - recommend lap chole. Informed consent was obtained after detailed explanation of risks, including bleeding, infection, biloma, hematoma, injury to common bile duct, need for IOC to delineate anatomy, and need for conversion to open procedure. All questions answered to the patient's satisfaction. FEN - NPO except sips/chips Dispo - anticipate home post-op    Dreama GEANNIE Hanger, MD General and Trauma Surgery [facility]     [1]  Allergies Allergen Reactions   Penicillins Hives   Tomato Hives   "

## 2024-06-03 NOTE — Transfer of Care (Signed)
 Immediate Anesthesia Transfer of Care Note  Patient: Peggy Patel  Procedure(s) Performed: LAPAROSCOPIC CHOLECYSTECTOMY (Abdomen)  Patient Location: PACU  Anesthesia Type:General  Level of Consciousness: sedated  Airway & Oxygen Therapy: Patient Spontanous Breathing and Patient connected to face mask oxygen  Post-op Assessment: Report given to RN and Post -op Vital signs reviewed and stable  Post vital signs: Reviewed and stable  Last Vitals:  Vitals Value Taken Time  BP 151/90 06/03/24 12:31  Temp 37.3 C 06/03/24 12:30  Pulse 104 06/03/24 12:43  Resp 21 06/03/24 12:43  SpO2 98 % 06/03/24 12:43  Vitals shown include unfiled device data.  Last Pain:  Vitals:   06/03/24 1230  TempSrc:   PainSc: Asleep         Complications: No notable events documented.

## 2024-06-04 LAB — COMPREHENSIVE METABOLIC PANEL WITH GFR
ALT: 30 U/L (ref 0–44)
AST: 35 U/L (ref 15–41)
Albumin: 3.7 g/dL (ref 3.5–5.0)
Alkaline Phosphatase: 64 U/L (ref 38–126)
Anion gap: 10 (ref 5–15)
BUN: 12 mg/dL (ref 6–20)
CO2: 28 mmol/L (ref 22–32)
Calcium: 9 mg/dL (ref 8.9–10.3)
Chloride: 98 mmol/L (ref 98–111)
Creatinine, Ser: 0.61 mg/dL (ref 0.44–1.00)
GFR, Estimated: >60 mL/min
Glucose, Bld: 113 mg/dL — ABNORMAL HIGH (ref 70–99)
Potassium: 3.7 mmol/L (ref 3.5–5.1)
Sodium: 136 mmol/L (ref 135–145)
Total Bilirubin: 0.3 mg/dL (ref 0.0–1.2)
Total Protein: 7.9 g/dL (ref 6.5–8.1)

## 2024-06-04 LAB — CBC
HCT: 35.3 % — ABNORMAL LOW (ref 36.0–46.0)
Hemoglobin: 11 g/dL — ABNORMAL LOW (ref 12.0–15.0)
MCH: 25.4 pg — ABNORMAL LOW (ref 26.0–34.0)
MCHC: 31.2 g/dL (ref 30.0–36.0)
MCV: 81.5 fL (ref 80.0–100.0)
Platelets: 276 K/uL (ref 150–400)
RBC: 4.33 MIL/uL (ref 3.87–5.11)
RDW: 16.8 % — ABNORMAL HIGH (ref 11.5–15.5)
WBC: 19.4 K/uL — ABNORMAL HIGH (ref 4.0–10.5)
nRBC: 0 % (ref 0.0–0.2)

## 2024-06-04 NOTE — Progress Notes (Signed)
 "   Assessment & Plan: POD#1 - status post lap cholecystectomy - Dr. Polly, 06/03/2024 - nausea post op, now resolved - taking liquids, limited solid food - OOB to bathroom - encouraged ambulation  Offered discharge home today.  Patient refused stating she's having pain.  Home when comfortable.        Peggy Spinner, MD [facility] practice Office: [phone]        Chief Complaint: Acute cholecystitis  Subjective: Patient in bed, appears comfortable.  Liquids at bedside.  Up to bathroom.  Objective: Vital signs in last 24 hours: Temp:  [97.6 F (36.4 C)-99.1 F (37.3 C)] 98.8 F (37.1 C) (12/28 0836) Pulse Rate:  [78-106] 83 (12/28 0836) Resp:  [13-32] 18 (12/28 0836) BP: (103-178)/(50-101) 117/69 (12/28 0836) SpO2:  [92 %-98 %] 95 % (12/28 0836) Last BM Date : 06/02/24  Intake/Output from previous day: 12/27 0701 - 12/28 0700 In: 1120 [P.O.:120; I.V.:1000] Out: 10 [Blood:10] Intake/Output this shift: No intake/output data recorded.  Physical Exam: HEENT - sclerae clear, mucous membranes moist Neck - soft Abdomen - soft, obese; wounds dry and intact  Lab Results:  Recent Labs    06/03/24 1641 06/04/24 0748  WBC 17.2* 19.4*  HGB 11.7* 11.0*  HCT 38.1 35.3*  PLT 282 276   BMET Recent Labs    06/02/24 2143 06/03/24 1641 06/04/24 0748  NA 138  --  136  K 4.3  --  3.7  CL 100  --  98  CO2 28  --  28  GLUCOSE 141*  --  113*  BUN 15  --  12  CREATININE 0.72 0.63 0.61  CALCIUM 9.5  --  9.0   PT/INR No results for input(s): LABPROT, INR in the last 72 hours. Comprehensive Metabolic Panel:    Component Value Date/Time   NA 136 06/04/2024 0748   NA 138 06/02/2024 2143   NA 138 10/17/2021 1043   K 3.7 06/04/2024 0748   K 4.3 06/02/2024 2143   CL 98 06/04/2024 0748   CL 100 06/02/2024 2143   CO2 28 06/04/2024 0748   CO2 28 06/02/2024 2143   BUN 12 06/04/2024 0748   BUN 15 06/02/2024 2143   BUN 8 10/17/2021 1043    CREATININE 0.61 06/04/2024 0748   CREATININE 0.63 06/03/2024 1641   GLUCOSE 113 (H) 06/04/2024 0748   GLUCOSE 141 (H) 06/02/2024 2143   CALCIUM 9.0 06/04/2024 0748   CALCIUM 9.5 06/02/2024 2143   AST 35 06/04/2024 0748   AST 20 06/02/2024 2143   ALT 30 06/04/2024 0748   ALT 15 06/02/2024 2143   ALKPHOS 64 06/04/2024 0748   ALKPHOS 75 06/02/2024 2143   BILITOT 0.3 06/04/2024 0748   BILITOT 0.3 06/02/2024 2143   BILITOT <0.2 10/17/2021 1043   PROT 7.9 06/04/2024 0748   PROT 9.0 (H) 06/02/2024 2143   PROT 7.6 10/17/2021 1043   ALBUMIN 3.7 06/04/2024 0748   ALBUMIN 4.3 06/02/2024 2143   ALBUMIN 3.9 10/17/2021 1043    Studies/Results: CT ABDOMEN PELVIS W CONTRAST Result Date: 06/03/2024 EXAM: CT ABDOMEN AND PELVIS WITH CONTRAST 06/03/2024 04:38:50 AM TECHNIQUE: CT of the abdomen and pelvis was performed with the administration of 75 mL of iohexol  (OMNIPAQUE ) 350 MG/ML injection. Multiplanar reformatted images are provided for review. Automated exposure control, iterative reconstruction, and/or weight-based adjustment of the mA/kV was utilized to reduce the radiation dose to as low as reasonably achievable. COMPARISON: Pelvis ultrasound 12/07/2023. CLINICAL HISTORY: 33 year old female with  acute, nonlocalized abdominal pain and acute onset right upper quadrant pain. FINDINGS: LOWER CHEST: No acute abnormality. LIVER: Liver enhancement remains within normal limits. GALLBLADDER AND BILE DUCTS: The gallbladder wall appears indistinct throughout, and there is a large lipid laden stone in the gallbladder fundus measuring 2.7 mm. See coronal image 101 demonstrating either trace pericholecystic fluid or abnormal gallbladder wall thickening. Bile ducts are nonenlarged. SPLEEN: No acute abnormality. PANCREAS: No acute abnormality. ADRENAL GLANDS: No acute abnormality. KIDNEYS, URETERS AND BLADDER: No stones in the kidneys or ureters. No hydronephrosis. No perinephric or periureteral stranding.  Diminutive bladder. GI AND BOWEL: Small volume retained fluid in the stomach. Decompressed duodenum. Normal gas containing appendix tracking medial from the cecum on series 3 image 62, coronal image 83. Negative large bowel. Nondilated small bowel. There is no bowel obstruction. PERITONEUM AND RETROPERITONEUM: No ascites. No free air. VASCULATURE: Suboptimal intravascular contrast bolus but the major arterial structures and portal venous system appear patent and unremarkable. Aorta is normal in caliber. LYMPH NODES: No lymphadenopathy. REPRODUCTIVE ORGANS: No acute abnormality. BONES AND SOFT TISSUES: No acute osseous abnormality. No focal soft tissue abnormality. IMPRESSION: 1. Abnormal gallbladder, with a 2.7 cm stone in the fundus and CT evidence of abnormal wall thickening. 2. Constellation of clinical and imaging findings strongly suggestive of acute cholecystitis. Right upper quadrant ultrasound would be complementary. 3. Normal appendix. No other acute or inflammatory process identified in the abdomen or pelvis. Electronically signed by: Helayne Hurst MD 06/03/2024 04:52 AM EST RP Workstation: [id number]      Peggy Patel 06/04/2024  Patient ID: Peggy Patel, female   DOB: 09-18-1990, 33 y.o.   MRN: [id number]  "

## 2024-06-05 ENCOUNTER — Encounter (HOSPITAL_COMMUNITY): Payer: Self-pay | Admitting: General Surgery

## 2024-06-05 ENCOUNTER — Other Ambulatory Visit (HOSPITAL_COMMUNITY): Payer: Self-pay

## 2024-06-05 MED ORDER — OXYCODONE HCL 5 MG PO TABS
5.0000 mg | ORAL_TABLET | Freq: Four times a day (QID) | ORAL | 0 refills | Status: AC | PRN
  Filled 2024-06-05: qty 20, 5d supply, fill #0

## 2024-06-05 MED ORDER — CYCLOBENZAPRINE HCL 5 MG PO TABS
5.0000 mg | ORAL_TABLET | Freq: Three times a day (TID) | ORAL | 0 refills | Status: AC
  Filled 2024-06-05: qty 30, 10d supply, fill #0

## 2024-06-05 NOTE — Discharge Instructions (Signed)
 [facility] ______[facility] LAPAROSCOPIC SURGERY: POST OP INSTRUCTIONS Always review your discharge instruction sheet given to you by the facility where your surgery was performed. IF YOU HAVE DISABILITY OR FAMILY LEAVE FORMS, YOU MUST BRING THEM TO THE OFFICE FOR PROCESSING.   DO NOT GIVE THEM TO YOUR DOCTOR.  A prescription for pain medication may be given to you upon discharge.  Take your pain medication as prescribed, if needed.  If narcotic pain medicine is not needed, then you may take acetaminophen  (Tylenol ) or ibuprofen  (Advil ) as needed. Take your usually prescribed medications unless otherwise directed. If you need a refill on your pain medication, please contact your pharmacy.  They will contact our office to request authorization. Prescriptions will not be filled after 5pm or on week-ends. You should follow a light diet the first few days after arrival home, such as soup and crackers, etc.  Be sure to include lots of fluids daily. Most patients will experience some swelling and bruising in the area of the incisions.  Ice packs will help.  Swelling and bruising can take several days to resolve.  It is common to experience some constipation if taking pain medication after surgery.  Increasing fluid intake and taking a stool softener (such as Colace) will usually help or prevent this problem from occurring.  A mild laxative (Milk of Magnesia or Miralax) should be taken according to package instructions if there are no bowel movements after 48 hours. Unless discharge instructions indicate otherwise, you may remove your bandages 24-48 hours after surgery, and you may shower at that time.  You may have steri-strips (small skin tapes) in place directly over the incision.  These strips should be left on the skin for 7-10 days.  If your surgeon used skin glue on the incision, you may shower in 24 hours.  The glue will flake off over the next 2-3 weeks.  Any sutures or staples will be  removed at the office during your follow-up visit. ACTIVITIES:  You may resume regular (light) daily activities beginning the next day--such as daily self-care, walking, climbing stairs--gradually increasing activities as tolerated.  You may have sexual intercourse when it is comfortable.  Refrain from any heavy lifting or straining until approved by your doctor. You may drive when you are no longer taking prescription pain medication, you can comfortably wear a seatbelt, and you can safely maneuver your car and apply brakes. RETURN TO WORK:  __________________________________________________________ Peggy Patel should see your doctor in the office for a follow-up appointment approximately 2-3 weeks after your surgery.  Make sure that you call for this appointment within a day or two after you arrive home to insure a convenient appointment time. OTHER INSTRUCTIONS: __________________________________________________________________________________________________________________________ __________________________________________________________________________________________________________________________ WHEN TO CALL YOUR DOCTOR: Fever over 101.0 Inability to urinate Continued bleeding from incision. Increased pain, redness, or drainage from the incision. Increasing abdominal pain  The clinic staff is available to answer your questions during regular business hours.  Please don't hesitate to call and ask to speak to one of the nurses for clinical concerns.  If you have a medical emergency, go to the nearest emergency room or call 911.  A surgeon from [facility] is always on call at the hospital. [address], [address], [place], [place]  [address] ? [address] [address], [place], [place]   [address] [phone] ? [phone] ? FAX [phone] Web site: [email or web address]

## 2024-06-05 NOTE — Discharge Summary (Signed)
 Physician Discharge Summary  Patient ID: Peggy Patel MRN: [id number] DOB/AGE: Feb 18, 1991 33 y.o.  Admit date: 06/02/2024 Discharge date: 06/05/2024  Admission Diagnoses:  Acute calculus cholecystitis  Discharge Diagnoses: Same Principal Problem:   Acute cholecystitis   Discharged Condition: good  Hospital Course: Admitted 06/03/24 with RUQ pain, nausea, and vomiting.  Diagnosed with acute cholecystitis.  Laparoscopic cholecystectomy by Dr. Polly 06/03/24 - gallbladder with significant inflammation.  Was not ready for discharge on POD #1 due to pain.  Patient is still having soreness at her incisions, but tolerating with Oxycodone  and Flexeril .    Significant Diagnostic Studies: pathology pending.    CT ABDOMEN PELVIS W CONTRAST Result Date: 06/03/2024 EXAM: CT ABDOMEN AND PELVIS WITH CONTRAST 06/03/2024 04:38:50 AM TECHNIQUE: CT of the abdomen and pelvis was performed with the administration of 75 mL of iohexol  (OMNIPAQUE ) 350 MG/ML injection. Multiplanar reformatted images are provided for review. Automated exposure control, iterative reconstruction, and/or weight-based adjustment of the mA/kV was utilized to reduce the radiation dose to as low as reasonably achievable. COMPARISON: Pelvis ultrasound 12/07/2023. CLINICAL HISTORY: 33 year old female with acute, nonlocalized abdominal pain and acute onset right upper quadrant pain. FINDINGS: LOWER CHEST: No acute abnormality. LIVER: Liver enhancement remains within normal limits. GALLBLADDER AND BILE DUCTS: The gallbladder wall appears indistinct throughout, and there is a large lipid laden stone in the gallbladder fundus measuring 2.7 mm. See coronal image 101 demonstrating either trace pericholecystic fluid or abnormal gallbladder wall thickening. Bile ducts are nonenlarged. SPLEEN: No acute abnormality. PANCREAS: No acute abnormality. ADRENAL GLANDS: No acute abnormality. KIDNEYS, URETERS AND BLADDER: No stones in the kidneys or  ureters. No hydronephrosis. No perinephric or periureteral stranding. Diminutive bladder. GI AND BOWEL: Small volume retained fluid in the stomach. Decompressed duodenum. Normal gas containing appendix tracking medial from the cecum on series 3 image 62, coronal image 83. Negative large bowel. Nondilated small bowel. There is no bowel obstruction. PERITONEUM AND RETROPERITONEUM: No ascites. No free air. VASCULATURE: Suboptimal intravascular contrast bolus but the major arterial structures and portal venous system appear patent and unremarkable. Aorta is normal in caliber. LYMPH NODES: No lymphadenopathy. REPRODUCTIVE ORGANS: No acute abnormality. BONES AND SOFT TISSUES: No acute osseous abnormality. No focal soft tissue abnormality. IMPRESSION: 1. Abnormal gallbladder, with a 2.7 cm stone in the fundus and CT evidence of abnormal wall thickening. 2. Constellation of clinical and imaging findings strongly suggestive of acute cholecystitis. Right upper quadrant ultrasound would be complementary. 3. Normal appendix. No other acute or inflammatory process identified in the abdomen or pelvis. Electronically signed by: Helayne Hurst MD 06/03/2024 04:52 AM EST RP Workstation: [id number]     Treatments: surgery: laparoscopic cholecystectomy 06/03/24 - Dr. Polly  Discharge Exam: Blood pressure 135/86, pulse 81, temperature 98.4 F (36.9 C), resp. rate 18, height 5' 7 (1.702 m), weight (!) 160 kg, SpO2 93%. Abd - soft, obese Incisions c/d/I Mild RUQ tenderness  Disposition: Discharge disposition: 01-Home or Self Care       Discharge Instructions     Call MD for:  persistant nausea and vomiting   Complete by: As directed    Call MD for:  redness, tenderness, or signs of infection (pain, swelling, redness, odor or green/yellow discharge around incision site)   Complete by: As directed    Call MD for:  severe uncontrolled pain   Complete by: As directed    Call MD for:  temperature >100.4   Complete  by: As directed    Diet general   Complete by:  As directed    Driving Restrictions   Complete by: As directed    Do not drive while taking pain medications   Increase activity slowly   Complete by: As directed    May shower / Bathe   Complete by: As directed       Allergies as of 06/05/2024       Reactions   Penicillins Hives   Tomato Hives        Medication List     STOP taking these medications    FLUoxetine  20 MG capsule Commonly known as: PROzac    hydrOXYzine  10 MG tablet Commonly known as: ATARAX        TAKE these medications    aspirin-acetaminophen -caffeine 250-250-65 MG tablet Commonly known as: EXCEDRIN MIGRAINE Take 1-2 tablets by mouth every 6 (six) hours as needed for headache.   cyclobenzaprine  5 MG tablet Commonly known as: FLEXERIL  Take 1 tablet (5 mg total) by mouth 3 (three) times daily.   fluticasone  50 MCG/ACT nasal spray Commonly known as: FLONASE  Place 1 spray into both nostrils daily. What changed:  when to take this reasons to take this   levonorgestrel  20 MCG/DAY Iud Commonly known as: MIRENA  1 each by Intrauterine route once.   levothyroxine  112 MCG tablet Commonly known as: SYNTHROID  Take 1 tablet (112 mcg total) by mouth every morning. 30 minutes before food   oxyCODONE  5 MG immediate release tablet Commonly known as: Oxy IR/ROXICODONE  Take 1 tablet (5 mg total) by mouth every 6 (six) hours as needed for moderate pain (pain score 4-6).         Signed: Donnice MARLA Lima 06/05/2024, 9:53 AM

## 2024-06-05 NOTE — Progress Notes (Signed)
 Discharge Nurse Summary: DC order noted per MD. DC RN at bedside with patient/mother. Patient agreeable with discharge plan with plan to use rideshare for transport home. AVS printed/reviewed. PIV removed, skin intact. No DME needs. No home meds. TOC meds pending pickup. CP/Edu resolved. Telemonitor not present on assessment. All belongings accounted for. Wounds CDI w/o drainage or bleeding. See LDAs. Patient wheeled downstairs by transport team for discharge by private auto. TOC meds picked up on the way out.  Rosario EMERSON Lund, RN

## 2024-06-08 LAB — SURGICAL PATHOLOGY

## 2024-07-06 ENCOUNTER — Encounter (HOSPITAL_COMMUNITY): Payer: Self-pay

## 2024-07-06 ENCOUNTER — Telehealth (HOSPITAL_COMMUNITY): Admitting: Psychiatry
